# Patient Record
Sex: Female | Born: 1970
Health system: Southern US, Community
[De-identification: ages and names within clinical notes are randomized; demographics above are authoritative.]

## PROBLEM LIST (undated history)

## (undated) DIAGNOSIS — I471 Supraventricular tachycardia: Secondary | ICD-10-CM

## (undated) DIAGNOSIS — I319 Disease of pericardium, unspecified: Secondary | ICD-10-CM

## (undated) DIAGNOSIS — E669 Obesity, unspecified: Secondary | ICD-10-CM

## (undated) HISTORY — DX: Obesity, unspecified: E66.9

## (undated) HISTORY — PX: WISDOM TOOTH EXTRACTION: SHX21

## (undated) HISTORY — DX: Disease of pericardium, unspecified: I31.9

## (undated) HISTORY — DX: Supraventricular tachycardia: I47.1

## (undated) HISTORY — PX: PERICARDIAL WINDOW: SHX2213

---

## 2017-05-13 ENCOUNTER — Encounter: Payer: Self-pay | Admitting: Family Medicine

## 2017-05-13 ENCOUNTER — Other Ambulatory Visit (HOSPITAL_COMMUNITY)
Admission: RE | Admit: 2017-05-13 | Discharge: 2017-05-13 | Disposition: A | Discharge status: Home / Self Care | Payer: BLUE CROSS/BLUE SHIELD | Source: Ambulatory Visit | Attending: Family Medicine | Admitting: Family Medicine

## 2017-05-13 ENCOUNTER — Ambulatory Visit: Payer: BLUE CROSS/BLUE SHIELD | Admitting: Family Medicine

## 2017-05-13 VITALS — BP 124/82 | HR 60 | Temp 98.2°F | Ht 68.0 in | Wt 216.8 lb

## 2017-05-13 DIAGNOSIS — E669 Obesity, unspecified: Secondary | ICD-10-CM | POA: Insufficient documentation

## 2017-05-13 DIAGNOSIS — I471 Supraventricular tachycardia, unspecified: Secondary | ICD-10-CM

## 2017-05-13 DIAGNOSIS — Z0000 Encounter for general adult medical examination without abnormal findings: Secondary | ICD-10-CM

## 2017-05-13 DIAGNOSIS — Z124 Encounter for screening for malignant neoplasm of cervix: Secondary | ICD-10-CM | POA: Insufficient documentation

## 2017-05-13 DIAGNOSIS — I319 Disease of pericardium, unspecified: Secondary | ICD-10-CM

## 2017-05-13 HISTORY — DX: Supraventricular tachycardia, unspecified: I47.10

## 2017-05-13 HISTORY — DX: Obesity, unspecified: E66.9

## 2017-05-13 HISTORY — DX: Supraventricular tachycardia: I47.1

## 2017-05-13 HISTORY — DX: Disease of pericardium, unspecified: I31.9

## 2017-05-13 MED ORDER — PREDNISONE 10 MG PO TABS: ORAL_TABLET | ORAL | 0 refills | Status: DC

## 2017-05-13 NOTE — Progress Notes (Signed)
Subjective:    [name] is a 46 y.o. female and is here for a comprehensive physical exam.  1. Routine physical examination   2. Pap smear for cervical cancer screening   3. Chronic idiopathic pericarditis, unspecified complication status   4. PSVT (paroxysmal supraventricular tachycardia) (HCC)   5. Obesity (BMI 30-39.9)    Health Maintenance Due  Topic Date Due  . HIV Screening  12/08/1987  . TETANUS/TDAP  12/08/1991  . PAP SMEAR  12/07/1993  . INFLUENZA VACCINE  01/30/2017   PMHx, SurgHx, SocialHx, Medications, and Allergies were reviewed in the Visit Navigator and updated as appropriate.   No past medical history on file. No past surgical history on file. No family history on file. Social History   Tobacco Use  . Smoking status: Never Smoker  . Smokeless tobacco: Never Used  Substance Use Topics  . Alcohol use: Yes    Comment: social   . Drug use: No    Review of Systems:   Pertinent items are noted in the HPI. Otherwise, ROS is negative.  Objective:   BP 124/82   Pulse 60   Temp 98.2 F (36.8 C) (Oral)   Ht 5\' 8"  (1.727 m)   Wt 216 lb 12.8 oz (98.3 kg)   SpO2 99%   BMI 32.96 kg/m    Wt Readings from Last 3 Encounters:  05/13/17 216 lb 12.8 oz (98.3 kg)     Ht Readings from Last 3 Encounters:  05/13/17 5\' 8"  (1.727 m)   General appearance: alert, cooperative and appears stated age. Head: normocephalic, without obvious abnormality, atraumatic. Neck: no adenopathy, supple, symmetrical, trachea midline; thyroid not enlarged, symmetric, no tenderness/mass/nodules. Lungs: clear to auscultation bilaterally. Heart: regular rate and rhythm Abdomen: soft, non-tender; no masses,  no organomegaly. Extremities: extremities normal, atraumatic, no cyanosis or edema. Skin: skin color, texture, turgor normal, no rashes or lesions. Lymph: cervical, supraclavicular, and axillary nodes normal; no abnormal inguinal nodes palpated. Neurologic: grossly  normal.  Pelvic:  External genitalia: no lesions.              Urethra: normal appearing urethra with no masses, tenderness or lesions.              Bartholins and Skenes: normal.               Vagina: normal appearing vagina with normal color and discharge, no lesions.              Cervix: normal appearance.              Pap and high risk HPV testing done: Yes.  .        Bimanual Exam:    Uterus: uterus is normal size, shape, consistency and nontender.                                      Adnexa: normal adnexa in size, nontender and no masses.                                       Assessment/Plan:   [name] was seen today for establish care.  Diagnoses and all orders for this visit:  Routine physical examination -     CBC with Differential/Platelet; Future -     Comprehensive metabolic panel; Future -  Lipid panel; Future  Pap smear for cervical cancer screening -     Cytology - PAP  Chronic idiopathic pericarditis, unspecified complication status Comments: Safety net Prednisone okay. Red flags reviewed.  Orders: -     Ambulatory referral to Cardiology -     predniSONE (DELTASONE) 10 MG tablet; 6-5-4-3-2-1-off  PSVT (paroxysmal supraventricular tachycardia) ([place]) Comments: Will refer to Cardiology for ongoing care.  Obesity (BMI 30-39.9) Comments: The patient is asked to make an attempt to improve diet and exercise patterns to aid in medical management of this problem.    Patient Counseling: [x]    Nutrition: Stressed importance of moderation in sodium/caffeine intake, saturated fat and cholesterol, caloric balance, sufficient intake of fresh fruits, vegetables, fiber, calcium, iron, and 1 mg of folate supplement per day (for females capable of pregnancy).  [x]    Stressed the importance of regular exercise.   [x]    Substance Abuse: Discussed cessation/primary prevention of tobacco, alcohol, or other drug use; driving or other dangerous activities under the influence;  availability of treatment for abuse.   [x]    Injury prevention: Discussed safety belts, safety helmets, smoke detector, smoking near bedding or upholstery.   [x]    Sexuality: Discussed sexually transmitted diseases, partner selection, use of condoms, avoidance of unintended pregnancy  and contraceptive alternatives.  [x]    Dental health: Discussed importance of regular tooth brushing, flossing, and dental visits.  [x]    Health maintenance and immunizations reviewed. Please refer to Health maintenance section.   Briscoe Deutscher, DO [place]

## 2017-05-15 ENCOUNTER — Encounter: Payer: Self-pay | Admitting: Surgical

## 2017-05-16 LAB — CYTOLOGY - PAP
Diagnosis: NEGATIVE
HPV: NOT DETECTED

## 2017-05-20 ENCOUNTER — Other Ambulatory Visit: Payer: Self-pay | Admitting: Family Medicine

## 2017-05-20 DIAGNOSIS — Z1231 Encounter for screening mammogram for malignant neoplasm of breast: Secondary | ICD-10-CM

## 2017-06-18 ENCOUNTER — Ambulatory Visit
Admission: RE | Admit: 2017-06-18 | Discharge: 2017-06-18 | Disposition: A | Discharge status: Home / Self Care | Payer: BLUE CROSS/BLUE SHIELD | Source: Ambulatory Visit | Attending: Family Medicine | Admitting: Family Medicine

## 2017-06-18 DIAGNOSIS — Z1231 Encounter for screening mammogram for malignant neoplasm of breast: Secondary | ICD-10-CM

## 2017-09-16 ENCOUNTER — Encounter: Payer: Self-pay | Admitting: Family Medicine

## 2017-09-16 ENCOUNTER — Ambulatory Visit: Payer: BLUE CROSS/BLUE SHIELD | Admitting: Family Medicine

## 2017-09-16 VITALS — BP 110/72 | HR 77 | Temp 98.1°F | Ht 68.0 in | Wt 220.4 lb

## 2017-09-16 DIAGNOSIS — M7632 Iliotibial band syndrome, left leg: Secondary | ICD-10-CM | POA: Diagnosis not present

## 2017-09-16 DIAGNOSIS — M6289 Other specified disorders of muscle: Secondary | ICD-10-CM

## 2017-09-16 DIAGNOSIS — E785 Hyperlipidemia, unspecified: Secondary | ICD-10-CM

## 2017-09-16 DIAGNOSIS — Z0000 Encounter for general adult medical examination without abnormal findings: Secondary | ICD-10-CM

## 2017-09-16 HISTORY — DX: Hyperlipidemia, unspecified: E78.5

## 2017-09-16 LAB — COMPREHENSIVE METABOLIC PANEL
ALT: 15 U/L (ref 0–35)
AST: 21 U/L (ref 0–37)
Albumin: 4 g/dL (ref 3.5–5.2)
Alkaline Phosphatase: 56 U/L (ref 39–117)
BUN: 12 mg/dL (ref 6–23)
CO2: 28 mEq/L (ref 19–32)
Calcium: 9.3 mg/dL (ref 8.4–10.5)
Chloride: 101 mEq/L (ref 96–112)
Creatinine, Ser: 0.65 mg/dL (ref 0.40–1.20)
GFR: 104.87 mL/min (ref >60.00)
Glucose, Bld: 83 mg/dL (ref 70–99)
Potassium: 3.8 mEq/L (ref 3.5–5.1)
Sodium: 137 mEq/L (ref 135–145)
Total Bilirubin: 0.3 mg/dL (ref 0.2–1.2)
Total Protein: 7.5 g/dL (ref 6.0–8.3)

## 2017-09-16 LAB — LIPID PANEL
Cholesterol: 239 mg/dL — ABNORMAL HIGH (ref 0–200)
HDL: 61.4 mg/dL (ref >39.00)
LDL Cholesterol: 141 mg/dL — ABNORMAL HIGH (ref 0–99)
NonHDL: 177.71
Total CHOL/HDL Ratio: 4
Triglycerides: 184 mg/dL — ABNORMAL HIGH (ref 0.0–149.0)
VLDL: 36.8 mg/dL (ref 0.0–40.0)

## 2017-09-16 LAB — CBC WITH DIFFERENTIAL/PLATELET
Basophils Absolute: 0 10*3/uL (ref 0.0–0.1)
Basophils Relative: 0.7 % (ref 0.0–3.0)
Eosinophils Absolute: 0.1 10*3/uL (ref 0.0–0.7)
Eosinophils Relative: 1.7 % (ref 0.0–5.0)
HCT: 42.1 % (ref 36.0–46.0)
Hemoglobin: 14.2 g/dL (ref 12.0–15.0)
Lymphocytes Relative: 23.9 % (ref 12.0–46.0)
Lymphs Abs: 1.2 10*3/uL (ref 0.7–4.0)
MCHC: 33.8 g/dL (ref 30.0–36.0)
MCV: 83.3 fl (ref 78.0–100.0)
Monocytes Absolute: 0.5 10*3/uL (ref 0.1–1.0)
Monocytes Relative: 10.1 % (ref 3.0–12.0)
Neutro Abs: 3.3 10*3/uL (ref 1.4–7.7)
Neutrophils Relative %: 63.6 % (ref 43.0–77.0)
Platelets: 251 10*3/uL (ref 150.0–400.0)
RBC: 5.05 Mil/uL (ref 3.87–5.11)
RDW: 13.2 % (ref 11.5–15.5)
WBC: 5.1 10*3/uL (ref 4.0–10.5)

## 2017-09-16 NOTE — Progress Notes (Signed)
   [name] is a 47 y.o. female here for an acute visit.  History of Present Illness:   Shaune Pascal CMA acting as scribe for Dr. Juleen China.  HPI: Patient comes in today for left leg pain.  She complains of pain in her left lateral distal hamstring and lateral knee.  This started a few weeks ago after working out on her elliptical machine.  She denies any trauma.  She states that she does not have pain when she is exercising but she will intermittently have pain that radiates down her anterior shin.  It is described as pounding.  No numbness or tingling or weakness.  No back pain.  She has done nothing for treatment.  PMHx, SurgHx, SocialHx, Medications, and Allergies were reviewed in the Visit Navigator and updated as appropriate.  Current Medications:   .  Norgestimate-Ethinyl Estradiol Triphasic (TRI-SPRINTEC) 0.18/0.215/0.25 MG-35 MCG tablet, Take 1 tablet daily by mouth., Disp: , Rfl:    No Known Allergies   Review of Systems:   Pertinent items are noted in the HPI. Otherwise, ROS is negative.  Vitals:   Vitals:   09/16/17 0924  BP: 110/72  Pulse: 77  Temp: 98.1 F (36.7 C)  TempSrc: Oral  SpO2: 98%  Weight: 220 lb 6.4 oz (100 kg)  Height: 5\' 8"  (1.727 m)     Body mass index is 33.51 kg/m.  Physical Exam:   Physical Exam  Constitutional: She appears well-nourished.  HENT:  Head: Normocephalic and atraumatic.  Eyes: EOM are normal. Pupils are equal, round, and reactive to light.  Neck: Normal range of motion. Neck supple.  Cardiovascular: Normal rate, regular rhythm, normal heart sounds and intact distal pulses.  Pulmonary/Chest: Effort normal.  Abdominal: Soft.  Musculoskeletal:       Left knee: She exhibits normal range of motion and no swelling. No medial joint line and no lateral joint line tenderness noted.       Legs: Skin: Skin is warm.  Psychiatric: She has a normal mood and affect. Her behavior is normal.  Nursing note and vitals  reviewed.   Assessment and Plan:   1. It band syndrome, left 2. Hamstring tightness of left lower extremity  Exercises provided from the sports medicine patient advisor handout on IT band syndrome.  If she has no improvement over the next 2-4 weeks, she should follow-up with sports medicine.  . Reviewed expectations re: course of current medical issues. . Discussed self-management of symptoms. . Outlined signs and symptoms indicating need for more acute intervention. . Patient verbalized understanding and all questions were answered. Marland Kitchen Health Maintenance issues including appropriate healthy diet, exercise, and smoking avoidance were discussed with patient. . See orders for this visit as documented in the electronic medical record. . Patient received an After Visit Summary.  CMA served as Education administrator during this visit. History, Physical, and Plan performed by medical provider. The above documentation has been reviewed and is accurate and complete. Briscoe Deutscher, D.O.  Briscoe Deutscher, DO [place], [facility] 09/16/2017

## 2017-09-16 NOTE — Addendum Note (Signed)
Addended by: Frutoso Chase A on: 09/16/2017 09:49 AM   Modules accepted: Orders

## 2017-10-29 ENCOUNTER — Encounter: Payer: Self-pay | Admitting: Cardiovascular Disease

## 2017-11-04 ENCOUNTER — Telehealth: Payer: Self-pay | Admitting: Family Medicine

## 2017-11-04 ENCOUNTER — Other Ambulatory Visit: Payer: Self-pay

## 2017-11-04 MED ORDER — NORGESTIM-ETH ESTRAD TRIPHASIC 0.18/0.215/0.25 MG-35 MCG PO TABS: 1.0000 | ORAL_TABLET | Freq: Every day | ORAL | 6 refills | Status: DC

## 2017-11-04 NOTE — Telephone Encounter (Signed)
Okay 

## 2017-11-04 NOTE — Telephone Encounter (Signed)
Never given by you in the past you did CPE in 11/18. Ok to fill?

## 2017-11-04 NOTE — Telephone Encounter (Signed)
Copied from [place] [phone]. Topic: Quick Communication - See Telephone Encounter >> Nov 04, 2017  2:10 PM Vernona Rieger wrote: CRM for notification. See Telephone encounter for: 11/04/17.  Norgestimate-Ethinyl Estradiol Triphasic (TRI-SPRINTEC) 0.18/0.215/0.25 MG-35 MCG tablet  [id number] - [place] [place] - [address] AT [place]

## 2017-11-18 ENCOUNTER — Ambulatory Visit: Payer: BLUE CROSS/BLUE SHIELD | Admitting: Cardiovascular Disease

## 2017-11-18 ENCOUNTER — Encounter: Payer: Self-pay | Admitting: Cardiovascular Disease

## 2017-11-18 VITALS — BP 126/80 | HR 60 | Ht 68.0 in | Wt 219.4 lb

## 2017-11-18 DIAGNOSIS — I471 Supraventricular tachycardia: Secondary | ICD-10-CM | POA: Diagnosis not present

## 2017-11-18 DIAGNOSIS — I32 Pericarditis in diseases classified elsewhere: Secondary | ICD-10-CM

## 2017-11-18 LAB — TSH: TSH: 0.618 u[IU]/mL (ref 0.450–4.500)

## 2017-11-18 NOTE — Patient Instructions (Signed)
Medication Instructions:  Your physician recommends that you continue on your current medications as directed. Please refer to the Current Medication list given to you today.   Labwork: Lab work to be done today--TSH  Testing/Procedures: Your physician has requested that you have an echocardiogram. Echocardiography is a painless test that uses sound waves to create images of your heart. It provides your doctor with information about the size and shape of your heart and how well your heart's chambers and valves are working. This procedure takes approximately one hour. There are no restrictions for this procedure.  Your physician has recommended that you wear an event monitor. Event monitors are medical devices that record the heart's electrical activity. Doctors most often [place] these monitors to diagnose arrhythmias. Arrhythmias are problems with the speed or rhythm of the heartbeat. The monitor is a small, portable device. You can wear one while you do your normal daily activities. This is usually used to diagnose what is causing palpitations/syncope (passing out).    Follow-Up: You are scheduled to see Dr. Angelena Form on August 2,2019 at 4:00 for follow up.   Any Other Special Instructions Will Be Listed Below (If Applicable).     If you need a refill on your cardiac medications before your next appointment, please call your pharmacy.

## 2017-11-18 NOTE — Progress Notes (Signed)
Chief Complaint  Patient presents with  . New Patient (Initial Visit)    palpitations   History of Present Illness: 47 yo female with history of pericarditis s/p pericardial window, [name] who is here today as a new consult, referred by Dr. Juleen China, for the evaluation of pericarditis.  She tells me today that she has been followed for idiopathic recurrent pericarditis in [place]. She was diagnosed with pericarditis/pericardial effusion in 2008 at [place]. She had a pericardial window at [facility] in 2008. She was told there thickening of her pericardium. She was treated with Methotrexate for a short time. She takes prednisone as needed when she has bad chest pain. She has had no recent echocardiogram. She has [name]. She wore a monitor while living in [place] 2 years ago and was noted to have [name]. She feels her heart racing several days per week. She has not been on therapy for her [name]. Longest episode occurred yesterday and lasted for 10 minutes. She moved to [facility] in June 2018. She teaches at [employer].   She has no chest pain, dyspnea, weight gain, LE edema.   Primary Care Physician: Briscoe Deutscher, DO   Past Medical History:  Diagnosis Date  . Chronic idiopathic pericarditis 05/13/2017  . Obesity (BMI 30-39.9) 05/13/2017   .  Marland Kitchen PSVT (paroxysmal supraventricular tachycardia) ([place]) 05/13/2017    Past Surgical History:  Procedure Laterality Date  . CESAREAN SECTION    . PERICARDIAL WINDOW      Current Outpatient Medications  Medication Sig Dispense Refill  . Norgestimate-Ethinyl Estradiol Triphasic (TRI-SPRINTEC) 0.18/0.215/0.25 MG-35 MCG tablet Take 1 tablet by mouth daily. 1 Package 6   No current facility-administered medications for this visit.     No Known Allergies  Social History   Socioeconomic History  . Marital status: Married    Spouse name: Not on file  . Number of children: 1  . Years of education: Not on file  . Highest education level: Not on file  Occupational  History  . Occupation: Professor at [employer]  . Financial resource strain: Not on file  . Food insecurity:    Worry: Not on file    Inability: Not on file  . Transportation needs:    Medical: Not on file    Non-medical: Not on file  Tobacco Use  . Smoking status: Never Smoker  . Smokeless tobacco: Never Used  Substance and Sexual Activity  . Alcohol use: Yes    Comment: social   . Drug use: No  . Sexual activity: Not on file  Lifestyle  . Physical activity:    Days per week: Not on file    Minutes per session: Not on file  . Stress: Not on file  Relationships  . Social connections:    Talks on phone: Not on file    Gets together: Not on file    Attends religious service: Not on file    Active member of club or organization: Not on file    Attends meetings of clubs or organizations: Not on file    Relationship status: Not on file  . Intimate partner violence:    Fear of current or ex partner: Not on file    Emotionally abused: Not on file    Physically abused: Not on file    Forced sexual activity: Not on file  Other Topics Concern  . Not on file  Social History Narrative  . Not on file    Family History  Problem Relation Age of Onset  . Arthritis Mother   . Diabetes Father   . Early death Father        Cardiogenic shock/CAD/MI  . High Cholesterol Father   . Cancer Maternal Grandfather   . Cancer Paternal Grandmother     Review of Systems:  As stated in the HPI and otherwise negative.   BP 126/80   Pulse 60   Ht 5\' 8"  (1.727 m)   Wt 219 lb 6.4 oz (99.5 kg)   SpO2 98%   BMI 33.36 kg/m   Physical Examination: General: Well developed, well nourished, NAD  HEENT: OP clear, mucus membranes moist  SKIN: warm, dry. No rashes. Neuro: No focal deficits  Musculoskeletal: Muscle strength 5/5 all ext  Psychiatric: Mood and affect normal  Neck: No JVD, no carotid bruits, no thyromegaly, no lymphadenopathy.  Lungs:Clear bilaterally, no  wheezes, rhonci, crackles Cardiovascular: Regular rate and rhythm. No murmurs, gallops or rubs. Abdomen:Soft. Bowel sounds present. Non-tender.  Extremities: No lower extremity edema. Pulses are 2 + in the bilateral DP/PT.  EKG:  EKG is ordered today. The ekg ordered today demonstrates NSR, rate 60 bpm  Recent Labs: 09/16/2017: ALT 15; BUN 12; Creatinine, Ser 0.65; Hemoglobin 14.2; Platelets 251.0; Potassium 3.8; Sodium 137   Lipid Panel    Component Value Date/Time   CHOL 239 (H) 09/16/2017 0950   TRIG 184.0 (H) 09/16/2017 0950   HDL 61.40 09/16/2017 0950   CHOLHDL 4 09/16/2017 0950   VLDL 36.8 09/16/2017 0950   LDLCALC 141 (H) 09/16/2017 0950     Wt Readings from Last 3 Encounters:  11/18/17 219 lb 6.4 oz (99.5 kg)  09/16/17 220 lb 6.4 oz (100 kg)  05/13/17 216 lb 12.8 oz (98.3 kg)     Other studies Reviewed: Additional studies/ records that were reviewed today include: . Review of the above records demonstrates:    Assessment and Plan:   1. [name]: She is known to have [name]. She has been on no medical therapy. Episodes occur several times per week and last for a few minutes. Will arrange 30 day monitor now. Consider adding calcium channel blocker but if she cannot tolerate due to bradycardia will consider ablation. Check echo and TSH.   2. Chronic pericarditis: She has had no recent chest pain. She will use prednisone as needed. Will arrange an echo to assess her LV function and exclude pericardial   Current medicines are reviewed at length with the patient today.  The patient does not have concerns regarding medicines.  The following changes have been made:  no change  Labs/ tests ordered today include:   Orders Placed This Encounter  Procedures  . TSH  . CARDIAC EVENT MONITOR  . ECHOCARDIOGRAM COMPLETE     Disposition:   FU with me in 6 weeks   Signed, Lauree Chandler, MD 11/18/2017 9:21 AM    [facility] [address],  [place], [place]  [address] Phone: [phone]; Fax: [phone]

## 2017-11-18 NOTE — Addendum Note (Signed)
Addended by: Mendel Ryder on: 11/18/2017 11:39 AM   Modules accepted: Orders

## 2017-11-29 ENCOUNTER — Ambulatory Visit (INDEPENDENT_AMBULATORY_CARE_PROVIDER_SITE_OTHER): Payer: BLUE CROSS/BLUE SHIELD

## 2017-11-29 ENCOUNTER — Ambulatory Visit (HOSPITAL_COMMUNITY): Payer: BLUE CROSS/BLUE SHIELD | Attending: Cardiology

## 2017-11-29 ENCOUNTER — Other Ambulatory Visit: Payer: Self-pay

## 2017-11-29 DIAGNOSIS — I32 Pericarditis in diseases classified elsewhere: Secondary | ICD-10-CM | POA: Diagnosis not present

## 2017-11-29 DIAGNOSIS — I471 Supraventricular tachycardia: Secondary | ICD-10-CM | POA: Insufficient documentation

## 2017-11-30 DIAGNOSIS — I471 Supraventricular tachycardia: Secondary | ICD-10-CM | POA: Diagnosis not present

## 2017-12-15 ENCOUNTER — Telehealth: Payer: Self-pay | Admitting: Cardiology

## 2017-12-15 NOTE — Telephone Encounter (Signed)
[facility] called stating that patient had [name] at 200bpm for 1 minute and resolved and now in NSR at 70bpm. Patient complained of palpitations at the time.

## 2017-12-16 MED ORDER — DILTIAZEM HCL 30 MG PO TABS: ORAL_TABLET | ORAL | 2 refills | Status: DC

## 2017-12-16 NOTE — Addendum Note (Signed)
Addended by: Thompson Grayer on: 12/16/2017 11:15 AM   Modules accepted: Orders

## 2017-12-16 NOTE — Telephone Encounter (Signed)
Pt notified. States she has been having similar episodes about once a week for many years.  She is aware of vagal maneuvers.  [facility] [address]

## 2017-12-16 NOTE — Telephone Encounter (Signed)
Pat, I am not sure when her monitor will be completed. She had a run of [name] per phone note. Can we call in Cardizem 30 mg po to use every six hours as needed for palpitations. I will review her monitor when complete and make more recommendations. She can also try vagal maneuvers to break the [name] if it recurs. She has had [name] in the past so this is not a surprise. She may opt for EP referral for ablation. Thanks, chris

## 2018-01-22 ENCOUNTER — Ambulatory Visit: Payer: BLUE CROSS/BLUE SHIELD | Admitting: Cardiology

## 2018-01-22 ENCOUNTER — Encounter: Payer: Self-pay | Admitting: Cardiology

## 2018-01-22 VITALS — BP 122/80 | HR 69 | Ht 68.0 in | Wt 227.8 lb

## 2018-01-22 DIAGNOSIS — I319 Disease of pericardium, unspecified: Secondary | ICD-10-CM | POA: Diagnosis not present

## 2018-01-22 DIAGNOSIS — I471 Supraventricular tachycardia: Secondary | ICD-10-CM | POA: Diagnosis not present

## 2018-01-22 NOTE — Progress Notes (Signed)
Electrophysiology Office Note   Date:  01/22/2018   ID:  [name], DOB 08/23/1970, MRN [id number]  PCP:  Briscoe Deutscher, DO  Cardiologist:  Angelena Form Primary Electrophysiologist:  Maclovia Uher Meredith Leeds, MD    Chief Complaint  Patient presents with  . Advice Only    [name]     History of Present Illness: [name] is a 47 y.o. female who is being seen today for the evaluation of [name] at the request of Darlina Guys. She is a Pharmacist, hospital at [employer]. Presenting today for electrophysiology evaluation.  She has a history of pericarditis status post pericardial window.  She was seen by general cardiology with [name].  She has been followed for recurrent idiopathic pericarditis in the past in [place].  She was diagnosed in 2008.  At that time, she had a pericardial window at [facility].  She had been treated with methotrexate in the past.  She also has [name].  She was living in [place] 2 years ago when she noted [name].  She has heart racing several times a week.  She has not yet been on therapy for [name].  Episodes last for up to 10 minutes.  Most of her episodes last between 2 to 3 minutes.  She has tried Valsalva maneuvers which have not been successful.    Today, she denies symptoms of chest pain, shortness of breath, orthopnea, PND, lower extremity edema, claudication, dizziness, presyncope, syncope, bleeding, or neurologic sequela. The patient is tolerating medications without difficulties.    Past Medical History:  Diagnosis Date  . Chronic idiopathic pericarditis 05/13/2017  . Obesity (BMI 30-39.9) 05/13/2017   .  Marland Kitchen PSVT (paroxysmal supraventricular tachycardia) ([place]) 05/13/2017   Past Surgical History:  Procedure Laterality Date  . CESAREAN SECTION    . PERICARDIAL WINDOW       Current Outpatient Medications  Medication Sig Dispense Refill  . diltiazem (CARDIZEM) 30 MG tablet Take one tablet by mouth every 6 hours as needed for palpitations. 30 tablet 2  .  Norgestimate-Ethinyl Estradiol Triphasic (TRI-SPRINTEC) 0.18/0.215/0.25 MG-35 MCG tablet Take 1 tablet by mouth daily. 1 Package 6   No current facility-administered medications for this visit.     Allergies:   Patient has no known allergies.   Social History:  The patient  reports that she has never smoked. She has never used smokeless tobacco. She reports that she drinks alcohol. She reports that she does not use drugs.   Family History:  The patient's family history includes Arthritis in her mother; Cancer in her maternal grandfather and paternal grandmother; Diabetes in her father; Early death in her father; High Cholesterol in her father.    ROS:  Please see the history of present illness.   Otherwise, review of systems is positive for palpitations.   All other systems are reviewed and negative.    PHYSICAL EXAM: VS:  BP 122/80   Pulse 69   Ht 5\' 8"  (1.727 m)   Wt 227 lb 12.8 oz (103.3 kg)   SpO2 98%   BMI 34.64 kg/m  , BMI Body mass index is 34.64 kg/m. GEN: Well nourished, well developed, in no acute distress  HEENT: normal  Neck: no JVD, carotid bruits, or masses Cardiac: RRR; no murmurs, rubs, or gallops,no edema  Respiratory:  clear to auscultation bilaterally, normal work of breathing GI: soft, nontender, nondistended, + BS MS: no deformity or atrophy  Skin: warm and dry Neuro:  Strength and sensation are intact Psych: euthymic mood, full affect  EKG:  EKG is not ordered today. Personal review of the ekg ordered 11/18/17 shows SR, rate 60  Recent Labs: 09/16/2017: ALT 15; BUN 12; Creatinine, Ser 0.65; Hemoglobin 14.2; Platelets 251.0; Potassium 3.8; Sodium 137 11/18/2017: TSH 0.618    Lipid Panel     Component Value Date/Time   CHOL 239 (H) 09/16/2017 0950   TRIG 184.0 (H) 09/16/2017 0950   HDL 61.40 09/16/2017 0950   CHOLHDL 4 09/16/2017 0950   VLDL 36.8 09/16/2017 0950   LDLCALC 141 (H) 09/16/2017 0950     Wt Readings from Last 3 Encounters:  01/22/18  227 lb 12.8 oz (103.3 kg)  11/18/17 219 lb 6.4 oz (99.5 kg)  09/16/17 220 lb 6.4 oz (100 kg)      Other studies Reviewed: Additional studies/ records that were reviewed today include: TTE 11/29/17  Review of the above records today demonstrates:  - Left ventricle: The cavity size was normal. Systolic function was   normal. The estimated ejection fraction was in the range of 55%   to 60%. Wall motion was normal; there were no regional wall   motion abnormalities. Left ventricular diastolic function   parameters were normal. - Atrial septum: No defect or patent foramen ovale was identified.  30 day monitor 11/29/17 - personally reviewed Sinus rhythm Premature ventricular contractions Several episodes of supraventricular tachycardia ([name])  ASSESSMENT AND PLAN:  1.  [name]: Wore a monitor that showed multiple episodes.  Episodes appear to be due to an AV nodal mechanism, likely AVNRT.  I discussed with her risks and benefits of ablation versus medical management.  Risks of ablation include bleeding, tamponade, heart block, stroke, among others.  This point, she would like to stay off of medications and is not symptomatic enough for ablation.  She Reighan Hipolito think about her options and I Marguerite Barba see her back in 3 months.  2.  Chronic pericarditis: No recent chest pain.Recent echo without pericardial effusion recent echo without pericardial effusion, otherwise normal.  Plan per primary cardiology.    Current medicines are reviewed at length with the patient today.   The patient does not have concerns regarding her medicines.  The following changes were made today:  none  Labs/ tests ordered today include:  No orders of the defined types were placed in this encounter.  Case discussed with primary cardiology  Disposition:   FU with Birdie Beveridge 3 months  Signed, Myrl Bynum Meredith Leeds, MD  01/22/2018 9:25 AM     [facility] [address] [place] [address] [phone]  (office) [phone] (fax)

## 2018-01-22 NOTE — Patient Instructions (Signed)
Medication Instructions:  Your physician recommends that you continue on your current medications as directed. Please refer to the Current Medication list given to you today.  * If you need a refill on your cardiac medications before your next appointment, please call your pharmacy.   Labwork: None ordered *We will only notify you of abnormal results, otherwise continue current treatment plan.  Testing/Procedures: None ordered  Follow-Up: Your physician recommends that you schedule a follow-up appointment in: 3 months with Dr. Curt Bears.  Your physician recommends that you re-schedule a follow-up appointment to: December with Dr. Angelena Form.  *Please note that any paperwork needing to be filled out by the provider will need to be addressed at the front desk prior to seeing the provider. Please note that any FMLA, disability or other documents regarding health condition is subject to a $25.00 charge that must be received prior to completion of paperwork in the form of a money order or check.  Thank you for choosing [facility]!!   Trinidad Curet, RN [phone]

## 2018-01-31 ENCOUNTER — Ambulatory Visit: Payer: BLUE CROSS/BLUE SHIELD | Admitting: Cardiovascular Disease

## 2018-04-19 ENCOUNTER — Other Ambulatory Visit: Payer: Self-pay | Admitting: Family Medicine

## 2018-04-21 NOTE — Telephone Encounter (Signed)
OK to refill. Patients last appointment 09/16/17 (acute). I have left message for her to call back to schedule.

## 2018-04-22 ENCOUNTER — Ambulatory Visit: Payer: BLUE CROSS/BLUE SHIELD | Admitting: Cardiology

## 2018-04-22 ENCOUNTER — Encounter: Payer: Self-pay | Admitting: Cardiology

## 2018-04-22 VITALS — BP 120/72 | HR 76 | Ht 68.0 in | Wt 234.0 lb

## 2018-04-22 DIAGNOSIS — I471 Supraventricular tachycardia: Secondary | ICD-10-CM

## 2018-04-22 DIAGNOSIS — I319 Disease of pericardium, unspecified: Secondary | ICD-10-CM | POA: Diagnosis not present

## 2018-04-22 NOTE — Progress Notes (Signed)
Electrophysiology Office Note   Date:  04/22/2018   ID:  [name], DOB 1970-07-26, MRN [id number]  PCP:  Briscoe Deutscher, DO  Cardiologist:  Angelena Form Primary Electrophysiologist:  Jammi Morrissette Meredith Leeds, MD    No chief complaint on file.    History of Present Illness: [name] is a 47 y.o. female who is being seen today for the evaluation of [name] at the request of Darlina Guys. She is a Pharmacist, hospital at [employer]. Presenting today for electrophysiology evaluation.  She has a history of pericarditis status post pericardial window.  She was seen by general cardiology with [name].  She has been followed for recurrent idiopathic pericarditis in the past in [place].  She was diagnosed in 2008.  At that time, she had a pericardial window at [facility].  She had been treated with methotrexate in the past.  She also has [name].  She was living in [place] 2 years ago when she noted [name].  She has heart racing several times a week.  She has not yet been on therapy for [name].  Episodes last for up to 10 minutes.  Most of her episodes last between 2 to 3 minutes.  She has tried Valsalva maneuvers which have not been successful.  Today, denies symptoms of palpitations, chest pain, shortness of breath, orthopnea, PND, lower extremity edema, claudication, dizziness, presyncope, syncope, bleeding, or neurologic sequela. The patient is tolerating medications without difficulties.  Overall she is feeling well.  She does continue to have episodes of [name], though they are remaining short-lived.  She has not had to take any of her PRN diltiazem.   Past Medical History:  Diagnosis Date  . Chronic idiopathic pericarditis 05/13/2017  . Obesity (BMI 30-39.9) 05/13/2017   .  Marland Kitchen PSVT (paroxysmal supraventricular tachycardia) ([place]) 05/13/2017   Past Surgical History:  Procedure Laterality Date  . CESAREAN SECTION    . PERICARDIAL WINDOW       Current Outpatient Medications  Medication Sig Dispense Refill  .  diltiazem (CARDIZEM) 30 MG tablet Take one tablet by mouth every 6 hours as needed for palpitations. 30 tablet 2  . TRI-PREVIFEM 0.18/0.215/0.25 MG-35 MCG tablet TAKE 1 TABLET BY MOUTH EVERY DAY 84 tablet 2   No current facility-administered medications for this visit.     Allergies:   Patient has no known allergies.   Social History:  The patient  reports that she has never smoked. She has never used smokeless tobacco. She reports that she drinks alcohol. She reports that she does not use drugs.   Family History:  The patient's family history includes Arthritis in her mother; Cancer in her maternal grandfather and paternal grandmother; Diabetes in her father; Early death in her father; High Cholesterol in her father.    ROS:  Please see the history of present illness.   Otherwise, review of systems is positive for none.   All other systems are reviewed and negative.   PHYSICAL EXAM: VS:  BP 120/72   Pulse 76   Ht 5\' 8"  (1.727 m)   Wt 234 lb (106.1 kg)   SpO2 97%   BMI 35.58 kg/m  , BMI Body mass index is 35.58 kg/m. GEN: Well nourished, well developed, in no acute distress  HEENT: normal  Neck: no JVD, carotid bruits, or masses Cardiac: RRR; no murmurs, rubs, or gallops,no edema  Respiratory:  clear to auscultation bilaterally, normal work of breathing GI: soft, nontender, nondistended, + BS MS: no deformity or atrophy  Skin: warm  and dry Neuro:  Strength and sensation are intact Psych: euthymic mood, full affect  EKG:  EKG is not ordered today. Personal review of the ekg ordered 11/18/17 shows SR, rate 60   Recent Labs: 09/16/2017: ALT 15; BUN 12; Creatinine, Ser 0.65; Hemoglobin 14.2; Platelets 251.0; Potassium 3.8; Sodium 137 11/18/2017: TSH 0.618    Lipid Panel     Component Value Date/Time   CHOL 239 (H) 09/16/2017 0950   TRIG 184.0 (H) 09/16/2017 0950   HDL 61.40 09/16/2017 0950   CHOLHDL 4 09/16/2017 0950   VLDL 36.8 09/16/2017 0950   LDLCALC 141 (H) 09/16/2017  0950     Wt Readings from Last 3 Encounters:  04/22/18 234 lb (106.1 kg)  01/22/18 227 lb 12.8 oz (103.3 kg)  11/18/17 219 lb 6.4 oz (99.5 kg)      Other studies Reviewed: Additional studies/ records that were reviewed today include: TTE 11/29/17  Review of the above records today demonstrates:  - Left ventricle: The cavity size was normal. Systolic function was   normal. The estimated ejection fraction was in the range of 55%   to 60%. Wall motion was normal; there were no regional wall   motion abnormalities. Left ventricular diastolic function   parameters were normal. - Atrial septum: No defect or patent foramen ovale was identified.  30 day monitor 11/29/17 - personally reviewed Sinus rhythm Premature ventricular contractions Several episodes of supraventricular tachycardia ([name])  ASSESSMENT AND PLAN:  1.  [name]: Her a cardiac monitor that showed multiple episodes of tachycardia that appeared to have an AV nodal mechanism, likely AVNRT.  I did discuss with her the options of ablation versus medical management.  At this point, she is having minimal symptoms from her short lived [name].  She would like to continue with the PRN diltiazem.  2.  Chronic pericarditis: No recent chest pain.  Echo without effusion.  Otherwise normal.  Plan per primary cardiology.    Current medicines are reviewed at length with the patient today.   The patient does not have concerns regarding her medicines.  The following changes were made today: None  Labs/ tests ordered today include:  No orders of the defined types were placed in this encounter.   Disposition:   FU with Niyonna Betsill 6 months  Signed, Gerik Coberly Meredith Leeds, MD  04/22/2018 8:32 AM     [facility] [address] [place] [address] [phone] (office) [phone] (fax)

## 2018-04-22 NOTE — Patient Instructions (Signed)
Medication Instructions:  Your physician recommends that you continue on your current medications as directed. Please refer to the Current Medication list given to you today  If you need a refill on your cardiac medications before your next appointment, please call your pharmacy.   Lab work: None ordered  Testing/Procedures: None ordered   Follow-Up: At [facility], you and your health needs are our priority.  As part of our continuing mission to provide you with exceptional heart care, we have created designated Provider Care Teams.  These Care Teams include your primary Cardiologist (physician) and Advanced Practice Providers (APPs -  Physician Assistants and Nurse Practitioners) who all work together to provide you with the care you need, when you need it. You will need a follow up appointment in 6 months.  Please call our office 2 months in advance to schedule this appointment.  You may see Will Martin Camnitz, MD or one of the following Advanced Practice Providers on your designated Care Team:   Amber Seiler, NP . Renee Ursuy, PA-C  Thank you for choosing [facility]!!   Navid Lenzen, RN [phone]      

## 2018-05-09 ENCOUNTER — Other Ambulatory Visit: Payer: Self-pay

## 2018-05-09 ENCOUNTER — Telehealth: Payer: Self-pay | Admitting: *Deleted

## 2018-05-09 DIAGNOSIS — Z23 Encounter for immunization: Secondary | ICD-10-CM

## 2018-05-09 MED ORDER — MEASLES, MUMPS & RUBELLA VAC IJ SOLR: 0.5000 mL | Freq: Once | INTRAMUSCULAR | Status: DC

## 2018-05-09 NOTE — Telephone Encounter (Signed)
Ok to be sent in or does she need levels checked?

## 2018-05-09 NOTE — Telephone Encounter (Signed)
Script called in. L/m ok to leave v/m per DPR. L/m to let patient know.

## 2018-05-09 NOTE — Telephone Encounter (Signed)
Okay Rx.

## 2018-05-09 NOTE — Telephone Encounter (Signed)
Copied from [place]. Topic: General - Other >> May 09, 2018  8:08 AM Carolyn Stare wrote:  Pt has a mump breaout at her job and they are [place] staff to get a 3rd MMR . She is asking a RX be [facility] [place] on [address]

## 2018-06-16 ENCOUNTER — Ambulatory Visit: Payer: BLUE CROSS/BLUE SHIELD | Admitting: Cardiovascular Disease

## 2018-06-16 ENCOUNTER — Encounter: Payer: Self-pay | Admitting: Cardiovascular Disease

## 2018-06-16 VITALS — BP 110/70 | HR 67 | Ht 68.0 in | Wt 234.6 lb

## 2018-06-16 DIAGNOSIS — I471 Supraventricular tachycardia: Secondary | ICD-10-CM | POA: Diagnosis not present

## 2018-06-16 DIAGNOSIS — I319 Disease of pericardium, unspecified: Secondary | ICD-10-CM

## 2018-06-16 MED ORDER — PREDNISONE 10 MG PO TABS: ORAL_TABLET | ORAL | 0 refills | Status: DC

## 2018-06-16 NOTE — Progress Notes (Signed)
Chief Complaint  Patient presents with  . Follow-up    [name]   History of Present Illness: 47 yo female with history of pericarditis s/p pericardial window and [name] who is here today for cardiac follow up. I saw her as a new patient in May 2019 for the evaluation of pericarditis.  She has been followed for idiopathic recurrent pericarditis in [place]. She was diagnosed with pericarditis/pericardial effusion in 2008 at [place]. She had a pericardial window at [facility] in 2008. She was told there was thickening of her pericardium. She was treated with Methotrexate for a short time. She takes prednisone as needed when she has bad chest pain. She has had no recent echocardiogram. She has [name]. She wore a monitor while living in [place] 2 years ago and was noted to have [name]. She feels her heart racing several days per week. She has not been on therapy for her [name]. She moved to [facility] in June 2018. She teaches at [employer]. Echo May 2019 with LVEF=55-60%. No valve disease. Cardiac monitor May 2019 with runs of [name], likely AVNRT. She was seen in our EP clinic by Dr. Lennie Odor and given minimal symptoms she continued with plan to use diltiazem as needed. Ablation was discussed and would be planned if symptoms worsen.   She is here today for follow up. The patient denies any dyspnea, lower extremity edema, orthopnea, PND, dizziness, near syncope or syncope. She had one episode of tachycardia recently that lasted 8 minutes. Rare chest pains.   Primary Care Physician: Briscoe Deutscher, DO  Past Medical History:  Diagnosis Date  . Chronic idiopathic pericarditis 05/13/2017  . Obesity (BMI 30-39.9) 05/13/2017   .  Marland Kitchen PSVT (paroxysmal supraventricular tachycardia) ([place]) 05/13/2017    Past Surgical History:  Procedure Laterality Date  . CESAREAN SECTION    . PERICARDIAL WINDOW      Current Outpatient Medications  Medication Sig Dispense Refill  . diltiazem (CARDIZEM) 30 MG tablet Take one tablet by mouth every  6 hours as needed for palpitations. 30 tablet 2  . TRI-PREVIFEM 0.18/0.215/0.25 MG-35 MCG tablet TAKE 1 TABLET BY MOUTH EVERY DAY 84 tablet 2  . predniSONE (DELTASONE) 10 MG tablet Take as directed. 10 tablet 0   Current Facility-Administered Medications  Medication Dose Route Frequency Provider Last Rate Last Dose  . measles, mumps & rubella vaccine (MMR) injection 0.5 mL  0.5 mL Subcutaneous Once Briscoe Deutscher, DO        No Known Allergies  Social History   Socioeconomic History  . Marital status: Married    Spouse name: Not on file  . Number of children: 1  . Years of education: Not on file  . Highest education level: Not on file  Occupational History  . Occupation: Professor at [employer]  . Financial resource strain: Not on file  . Food insecurity:    Worry: Not on file    Inability: Not on file  . Transportation needs:    Medical: Not on file    Non-medical: Not on file  Tobacco Use  . Smoking status: Never Smoker  . Smokeless tobacco: Never Used  Substance and Sexual Activity  . Alcohol use: Yes    Comment: social   . Drug use: No  . Sexual activity: Not on file  Lifestyle  . Physical activity:    Days per week: Not on file    Minutes per session: Not on file  . Stress: Not on file  Relationships  . Social connections:    Talks on phone: Not on file    Gets together: Not on file    Attends religious service: Not on file    Active member of club or organization: Not on file    Attends meetings of clubs or organizations: Not on file    Relationship status: Not on file  . Intimate partner violence:    Fear of current or ex partner: Not on file    Emotionally abused: Not on file    Physically abused: Not on file    Forced sexual activity: Not on file  Other Topics Concern  . Not on file  Social History Narrative  . Not on file    Family History  Problem Relation Age of Onset  . Arthritis Mother   . Diabetes Father   . Early death  Father        Cardiogenic shock/CAD/MI  . High Cholesterol Father   . Cancer Maternal Grandfather   . Cancer Paternal Grandmother     Review of Systems:  As stated in the HPI and otherwise negative.   BP 110/70   Pulse 67   Ht 5' 8"  (1.727 m)   Wt 234 lb 9.6 oz (106.4 kg)   SpO2 97%   BMI 35.67 kg/m   Physical Examination: General: Well developed, well nourished, NAD  HEENT: OP clear, mucus membranes moist  SKIN: warm, dry. No rashes. Neuro: No focal deficits  Musculoskeletal: Muscle strength 5/5 all ext  Psychiatric: Mood and affect normal  Neck: No JVD, no carotid bruits, no thyromegaly, no lymphadenopathy.  Lungs:Clear bilaterally, no wheezes, rhonci, crackles Cardiovascular: Regular rate and rhythm. No murmurs, gallops or rubs. Abdomen:Soft. Bowel sounds present. Non-tender.  Extremities: No lower extremity edema. Pulses are 2 + in the bilateral DP/PT.  Echo May 2019: Left ventricle: The cavity size was normal. Systolic function was   normal. The estimated ejection fraction was in the range of 55%   to 60%. Wall motion was normal; there were no regional wall   motion abnormalities. Left ventricular diastolic function   parameters were normal. - Atrial septum: No defect or patent foramen ovale was identified.  EKG:  EKG is not ordered today. The ekg ordered today demonstrates   Recent Labs: 09/16/2017: ALT 15; BUN 12; Creatinine, Ser 0.65; Hemoglobin 14.2; Platelets 251.0; Potassium 3.8; Sodium 137 11/18/2017: TSH 0.618   Lipid Panel    Component Value Date/Time   CHOL 239 (H) 09/16/2017 0950   TRIG 184.0 (H) 09/16/2017 0950   HDL 61.40 09/16/2017 0950   CHOLHDL 4 09/16/2017 0950   VLDL 36.8 09/16/2017 0950   LDLCALC 141 (H) 09/16/2017 0950     Wt Readings from Last 3 Encounters:  06/16/18 234 lb 9.6 oz (106.4 kg)  04/22/18 234 lb (106.1 kg)  01/22/18 227 lb 12.8 oz (103.3 kg)     Other studies Reviewed: Additional studies/ records that were reviewed  today include: . Review of the above records demonstrates:    Assessment and Plan:   1. [name]: Cardiac monitor in May 2019 showed multiple episodes of tachycardia that appear to be AVNRT. She has been seen in our EP clinic. Would need ablation if she becomes more symptomatic. For now continue Cardizem as needed.    2. Chronic pericarditis: No effusion by echo in May 2019. No chest pain.   Current medicines are reviewed at length with the patient today.  The patient does not have concerns regarding medicines.  The following changes have been made:  no change  Labs/ tests ordered today include:   No orders of the defined types were placed in this encounter.  Disposition:   FU with me in 12 months.   Signed, Lauree Chandler, MD 06/16/2018 9:22 AM    [facility] [place], [place], [place]  [address] Phone: [phone]; Fax: [phone]

## 2018-06-16 NOTE — Patient Instructions (Signed)
Medication Instructions:  Your physician recommends that you continue on your current medications as directed. Please refer to the Current Medication list given to you today. A prescription for prednisone to take as needed has been sent to your pharmacy If you need a refill on your cardiac medications before your next appointment, please call your pharmacy.   Lab work: none If you have labs (blood work) drawn today and your tests are completely normal, you will receive your results only by: Marland Kitchen MyChart Message (if you have MyChart) OR . A paper copy in the mail If you have any lab test that is abnormal or we need to change your treatment, we will call you to review the results.  Testing/Procedures: none  Follow-Up: At [facility], you and your health needs are our priority.  As part of our continuing mission to provide you with exceptional heart care, we have created designated Provider Care Teams.  These Care Teams include your primary Cardiologist (physician) and Advanced Practice Providers (APPs -  Physician Assistants and Nurse Practitioners) who all work together to provide you with the care you need, when you need it. You will need a follow up appointment in 12 months.  Please call our office 2 months in advance to schedule this appointment.  You may see Lauree Chandler, MD or one of the following Advanced Practice Providers on your designated Care Team:   [place], PA-C Melina Copa, PA-C . Ermalinda Barrios, PA-C  Any Other Special Instructions Will Be Listed Below (If Applicable).

## 2018-06-18 ENCOUNTER — Telehealth: Payer: Self-pay | Admitting: Family Medicine

## 2018-06-18 NOTE — Telephone Encounter (Signed)
See note ° °Copied from CRM #[id number]. Topic: General - Other °>> Jun 18, 2018  3:35 PM Williams-Neal, Sade R wrote: °Patient called in to schedule a appt to receive the MMR vaccine. There was [facility] for measles, mumps & rubella vaccine (MMR) injection 0.5 mL. Patient states [facility] didn't receive it ° °

## 2018-06-19 NOTE — Telephone Encounter (Signed)
Called patient made app for her to come in for nurse visit. Apologized to patient that I had sent wrong and asked if she wanted me to resend to pharmacy but she would prefer to have in office.

## 2018-06-26 ENCOUNTER — Ambulatory Visit (INDEPENDENT_AMBULATORY_CARE_PROVIDER_SITE_OTHER): Payer: BLUE CROSS/BLUE SHIELD

## 2018-06-26 DIAGNOSIS — Z23 Encounter for immunization: Secondary | ICD-10-CM

## 2018-06-26 NOTE — Progress Notes (Signed)
Per orders of Dr. Rogers Blocker, injection of MMR given by Francella Solian. Patient tolerated injection well. VIS given at time of visit. Will call if any questions

## 2018-07-06 ENCOUNTER — Encounter: Payer: Self-pay | Admitting: Cardiovascular Disease

## 2018-07-18 ENCOUNTER — Other Ambulatory Visit: Payer: Self-pay | Admitting: Family Medicine

## 2018-07-18 DIAGNOSIS — Z1231 Encounter for screening mammogram for malignant neoplasm of breast: Secondary | ICD-10-CM

## 2018-07-28 ENCOUNTER — Ambulatory Visit
Admission: RE | Admit: 2018-07-28 | Discharge: 2018-07-28 | Disposition: A | Discharge status: Home / Self Care | Payer: BLUE CROSS/BLUE SHIELD | Source: Ambulatory Visit

## 2018-07-28 DIAGNOSIS — Z1231 Encounter for screening mammogram for malignant neoplasm of breast: Secondary | ICD-10-CM

## 2018-07-28 NOTE — Progress Notes (Signed)
Subjective:    [name] is a 48 y.o. female and is here for a comprehensive physical exam.  There are no preventive care reminders to display for this patient.  Current Outpatient Medications:  .  diltiazem (CARDIZEM) 30 MG tablet, Take one tablet by mouth every 6 hours as needed for palpitations., Disp: 30 tablet, Rfl: 2 .  predniSONE (DELTASONE) 10 MG tablet, Take 10 mg by mouth daily with breakfast. Take as needed., Disp: , Rfl:  .  norgestimate-ethinyl estradiol (PREVIFEM) 0.25-35 MG-MCG tablet, Take 1 tablet by mouth daily., Disp: 1 Package, Rfl: 11 .  terbinafine (LAMISIL) 250 MG tablet, Take 1 tablet (250 mg total) by mouth daily., Disp: 45 tablet, Rfl: 0  PMHx, SurgHx, SocialHx, Medications, and Allergies were reviewed in the Visit Navigator and updated as appropriate.   Past Medical History:  Diagnosis Date  . Chronic idiopathic pericarditis 05/13/2017  . Obesity (BMI 30-39.9) 05/13/2017  . PSVT (paroxysmal supraventricular tachycardia) ([place]) 05/13/2017     Past Surgical History:  Procedure Laterality Date  . CESAREAN SECTION    . PERICARDIAL WINDOW       Family History  Problem Relation Age of Onset  . Arthritis Mother   . Diabetes Father   . Early death Father        Cardiogenic shock/CAD/MI  . High Cholesterol Father   . Cancer Maternal Grandfather   . Cancer Paternal Grandmother     Social History   Tobacco Use  . Smoking status: Never Smoker  . Smokeless tobacco: Never Used  Substance Use Topics  . Alcohol use: Yes    Comment: social   . Drug use: No   Review of Systems:   Pertinent items are noted in the HPI. Otherwise, ROS is negative.  Objective:   BP 110/72   Pulse 68   Temp 98.7 F (37.1 C) (Oral)   Ht 5\' 8"  (1.727 m)   Wt 234 lb (106.1 kg)   SpO2 99%   BMI 35.58 kg/m   General appearance: alert, cooperative and appears stated age. Head: normocephalic, without obvious abnormality, atraumatic. Neck: no adenopathy,  supple, symmetrical, trachea midline; thyroid not enlarged, symmetric, no tenderness/mass/nodules. Lungs: clear to auscultation bilaterally. Heart: regular rate and rhythm Abdomen: soft, non-tender; no masses,  no organomegaly. Extremities: extremities normal, atraumatic, no cyanosis or edema. Skin: skin color, texture, turgor normal, no rashes or lesions. Lymph: cervical, supraclavicular, and axillary nodes normal; no abnormal inguinal nodes palpated. Neurologic: grossly normal.  Assessment/Plan:   [name] was seen today for annual exam.  Diagnoses and all orders for this visit:  Routine physical examination  Pure hypercholesterolemia -     Comprehensive metabolic panel -     Lipid panel  Obesity (BMI 30-39.9)  Fatigue, unspecified type -     CBC with Differential/Platelet -     TSH  Onychomycosis Comments: NEW. See orders. Orders: -     terbinafine (LAMISIL) 250 MG tablet; Take 1 tablet (250 mg total) by mouth daily.  Irregular menses Comments: Mild intermenstrual spotting. On triphasic. Will change to monophasic. Orders: -     norgestimate-ethinyl estradiol (PREVIFEM) 0.25-35 MG-MCG tablet; Take 1 tablet by mouth daily.  PSVT (paroxysmal supraventricular tachycardia) ([place]) Comments: No concerns.  Chronic idiopathic pericarditis Comments: No concerns.   Patient Counseling: [x]    Nutrition: Stressed importance of moderation in sodium/caffeine intake, saturated fat and cholesterol, caloric balance, sufficient intake of fresh fruits, vegetables, fiber, calcium, iron, and 1 mg of folate supplement per day (  for females capable of pregnancy).  [x]    Stressed the importance of regular exercise.   [x]    Substance Abuse: Discussed cessation/primary prevention of tobacco, alcohol, or other drug use; driving or other dangerous activities under the influence; availability of treatment for abuse.   [x]    Injury prevention: Discussed safety belts, safety helmets, smoke  detector, smoking near bedding or upholstery.   [x]    Sexuality: Discussed sexually transmitted diseases, partner selection, use of condoms, avoidance of unintended pregnancy  and contraceptive alternatives.  [x]    Dental health: Discussed importance of regular tooth brushing, flossing, and dental visits.  [x]    Health maintenance and immunizations reviewed. Please refer to Health maintenance section.   Briscoe Deutscher, DO [place]

## 2018-07-29 ENCOUNTER — Ambulatory Visit (INDEPENDENT_AMBULATORY_CARE_PROVIDER_SITE_OTHER): Payer: BLUE CROSS/BLUE SHIELD | Admitting: Family Medicine

## 2018-07-29 ENCOUNTER — Encounter: Payer: Self-pay | Admitting: Family Medicine

## 2018-07-29 VITALS — BP 110/72 | HR 68 | Temp 98.7°F | Ht 68.0 in | Wt 234.0 lb

## 2018-07-29 DIAGNOSIS — E669 Obesity, unspecified: Secondary | ICD-10-CM | POA: Diagnosis not present

## 2018-07-29 DIAGNOSIS — B351 Tinea unguium: Secondary | ICD-10-CM

## 2018-07-29 DIAGNOSIS — N926 Irregular menstruation, unspecified: Secondary | ICD-10-CM

## 2018-07-29 DIAGNOSIS — R5383 Other fatigue: Secondary | ICD-10-CM

## 2018-07-29 DIAGNOSIS — I471 Supraventricular tachycardia, unspecified: Secondary | ICD-10-CM

## 2018-07-29 DIAGNOSIS — E7800 Pure hypercholesterolemia, unspecified: Secondary | ICD-10-CM | POA: Diagnosis not present

## 2018-07-29 DIAGNOSIS — I319 Disease of pericardium, unspecified: Secondary | ICD-10-CM

## 2018-07-29 DIAGNOSIS — Z0000 Encounter for general adult medical examination without abnormal findings: Secondary | ICD-10-CM

## 2018-07-29 HISTORY — DX: Tinea unguium: B35.1

## 2018-07-29 LAB — COMPREHENSIVE METABOLIC PANEL
ALT: 13 U/L (ref 0–35)
AST: 14 U/L (ref 0–37)
Albumin: 3.8 g/dL (ref 3.5–5.2)
Alkaline Phosphatase: 55 U/L (ref 39–117)
BUN: 12 mg/dL (ref 6–23)
CO2: 29 mEq/L (ref 19–32)
Calcium: 9.3 mg/dL (ref 8.4–10.5)
Chloride: 103 mEq/L (ref 96–112)
Creatinine, Ser: 0.68 mg/dL (ref 0.40–1.20)
GFR: 93.3 mL/min (ref >60.00)
Glucose, Bld: 69 mg/dL — ABNORMAL LOW (ref 70–99)
Potassium: 4.4 mEq/L (ref 3.5–5.1)
Sodium: 138 mEq/L (ref 135–145)
Total Bilirubin: 0.4 mg/dL (ref 0.2–1.2)
Total Protein: 6.6 g/dL (ref 6.0–8.3)

## 2018-07-29 LAB — CBC WITH DIFFERENTIAL/PLATELET
Basophils Absolute: 0.1 10*3/uL (ref 0.0–0.1)
Basophils Relative: 0.7 % (ref 0.0–3.0)
Eosinophils Absolute: 0.2 10*3/uL (ref 0.0–0.7)
Eosinophils Relative: 2.5 % (ref 0.0–5.0)
HCT: 41.7 % (ref 36.0–46.0)
Hemoglobin: 13.8 g/dL (ref 12.0–15.0)
Lymphocytes Relative: 29.6 % (ref 12.0–46.0)
Lymphs Abs: 2.2 10*3/uL (ref 0.7–4.0)
MCHC: 33 g/dL (ref 30.0–36.0)
MCV: 82.9 fl (ref 78.0–100.0)
Monocytes Absolute: 0.4 10*3/uL (ref 0.1–1.0)
Monocytes Relative: 5.8 % (ref 3.0–12.0)
Neutro Abs: 4.5 10*3/uL (ref 1.4–7.7)
Neutrophils Relative %: 61.4 % (ref 43.0–77.0)
Platelets: 273 10*3/uL (ref 150.0–400.0)
RBC: 5.03 Mil/uL (ref 3.87–5.11)
RDW: 12.7 % (ref 11.5–15.5)
WBC: 7.3 10*3/uL (ref 4.0–10.5)

## 2018-07-29 LAB — TSH: TSH: 0.93 u[IU]/mL (ref 0.35–4.50)

## 2018-07-29 LAB — LIPID PANEL
Cholesterol: 238 mg/dL — ABNORMAL HIGH (ref 0–200)
HDL: 57 mg/dL (ref >39.00)
LDL Cholesterol: 150 mg/dL — ABNORMAL HIGH (ref 0–99)
NonHDL: 180.82
Total CHOL/HDL Ratio: 4
Triglycerides: 152 mg/dL — ABNORMAL HIGH (ref 0.0–149.0)
VLDL: 30.4 mg/dL (ref 0.0–40.0)

## 2018-07-29 MED ORDER — NORGESTIMATE-ETH ESTRADIOL 0.25-35 MG-MCG PO TABS: 1.0000 | ORAL_TABLET | Freq: Every day | ORAL | 11 refills | Status: DC

## 2018-07-29 MED ORDER — TERBINAFINE HCL 250 MG PO TABS: 250.0000 mg | ORAL_TABLET | Freq: Every day | ORAL | 0 refills | Status: AC

## 2018-09-01 ENCOUNTER — Encounter: Payer: Self-pay | Admitting: Sports Medicine

## 2018-09-01 ENCOUNTER — Ambulatory Visit: Payer: Self-pay

## 2018-09-01 ENCOUNTER — Ambulatory Visit: Payer: BLUE CROSS/BLUE SHIELD | Admitting: Sports Medicine

## 2018-09-01 VITALS — BP 112/70 | HR 69 | Ht 68.0 in | Wt 236.4 lb

## 2018-09-01 DIAGNOSIS — M25522 Pain in left elbow: Secondary | ICD-10-CM | POA: Diagnosis not present

## 2018-09-01 DIAGNOSIS — S5002XA Contusion of left elbow, initial encounter: Secondary | ICD-10-CM | POA: Diagnosis not present

## 2018-09-01 MED ORDER — DICLOFENAC SODIUM 2 % TD SOLN: 1.0000 "application " | Freq: Two times a day (BID) | TRANSDERMAL | 0 refills | Status: AC

## 2018-09-01 NOTE — Procedures (Signed)
LIMITED MSK ULTRASOUND OF Left elbow Images were obtained and interpreted by myself, Teresa Coombs, DO  Images have been saved and stored to PACS system. Images obtained on: GE S7 Ultrasound machine  FINDINGS:   Olecranon is normal in appearance overall however there is a small area of double lucency at the bone that may be indicative of a prior bony contusion.  The olecranon bursa does have a small amount of fluid within it which is different compared to the contralateral side.  IMPRESSION:  1. Elbow contusion with small olecranon bursitis, traumatic

## 2018-09-01 NOTE — Progress Notes (Signed)
[name]. [name], [name]  [name] - 48 y.o. female MRN [id number]  Date of birth: March 25, 1971  Visit Date: September 01, 2018  PCP: Briscoe Deutscher, DO   Referred by: Briscoe Deutscher, DO  SUBJECTIVE:  Chief Complaint  Patient presents with  . 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  She reports tripping over a step and landing directly on her elbow.  She is continued to have fairly significant pain especially with the right contact since that time.  There was significant bruising and ecchymosis initially but she has never lost any.  She denies any grip strength weakness.  She does type on a regular basis.  REVIEW OF SYSTEMS: No significant nighttime awakenings due to this issue. Denies fevers, chills, recent weight gain or weight loss.  No night sweats.  Pt denies any change in bowel or bladder habits, muscle weakness, numbness or falls associated with this pain. Otherwise 12 point review of systems performed and is negative    HISTORY:  Prior history reviewed and updated per electronic medical record.  Patient Active Problem List   Diagnosis Date Noted  . Onychomycosis 07/29/2018  . HLD (hyperlipidemia) 09/16/2017  . Chronic idiopathic pericarditis 05/13/2017  . PSVT (paroxysmal supraventricular tachycardia) ([place]) 05/13/2017  . Obesity (BMI 30-39.9) 05/13/2017   Social History   Occupational History  . Occupation: Professor at [employer]  . Smoking status: Never Smoker  . Smokeless tobacco: Never Used  Substance and Sexual Activity  . Alcohol use: Yes    Comment: social   . Drug use: No  . Sexual activity: Not on file   Social History   Social History Narrative  . Not on file    Past Medical History:  Diagnosis Date  . Chronic idiopathic pericarditis  05/13/2017  . Obesity (BMI 30-39.9) 05/13/2017  . PSVT (paroxysmal supraventricular tachycardia) ([place]) 05/13/2017     Past Surgical History:  Procedure Laterality Date  . CESAREAN SECTION    . PERICARDIAL WINDOW      family history includes Arthritis in her mother; Cancer in her maternal grandfather and paternal grandmother; Diabetes in her father; Early death in her father; High Cholesterol in her father.  OBJECTIVE:  VS:  HT:5\' 8"  (172.7 cm)   WT:236 lb 6.4 oz (107.2 kg)  BMI:35.95    BP:112/70  HR:69bpm  TEMP: ( )  RESP:98 %   PHYSICAL EXAM: Well-developed, Well-nourished and In no acute distress  Pupils are equal., EOM intact without nystagmus. and No scleral icterus.  Alert & appropriately interactive. and Not depressed or anxious appearing.  Warm and well perfused   Left is overall well aligned.  The left olecranon bursa is slightly more prominent than the right but this is minimal.  There is no significant focal tenderness over the olecranon bursa were posterior aspect of the olecranon.  She has full elbow flexion extension, supination and pronation.  Her strength is intact in all these planes as well.  Sensation in the upper extremity is normal.  Her elbow is ligamentously stable.   ASSESSMENT:   1. Left elbow pain   2. Contusion of left elbow, initial encounter      PROCEDURES:  None  PLAN:  Pertinent additional documentation may be included in corresponding procedure  notes, imaging studies, problem based documentation and patient instructions.  No problem-specific Assessment & Plan notes found for this encounter.   Symptoms are consistent with an elbow contusion and likely traumatic olecranon bursa that is small.  Sample of Pennsaid provided today.  If this is efficacious prescription can be sent in and she will call and let us know if she needs it.  Avoid direct contact cushioning and/or padded elbow support can be considered    Activity modifications  and the importance of avoiding exacerbating activities (limiting pain to no more than a 4 / 10 during or following activity) recommended and discussed.   Discussed red flag symptoms that warrant earlier emergent evaluation and patient voices understanding.     Meds ordered this encounter  Medications  . Diclofenac Sodium (PENNSAID) 2 % SOLN    Sig: Place 1 application onto the skin 2 (two) times daily for 1 day.    Dispense:  8 g    Refill:  0    Lab Orders  No laboratory test(s) ordered today    Imaging Orders     [place] [facility] POCT ULTRASOUND Referral Orders  No referral(s) requested today      Return if symptoms worsen or fail to improve.          Gerda Diss, [facility] Physician

## 2018-09-01 NOTE — Progress Notes (Addendum)
LIMITED MSK ULTRASOUND OF Left elbow Images were obtained and interpreted by myself, Teresa Coombs, DO  Images have been saved and stored to PACS system. Images obtained on: GE S7 Ultrasound machine  FINDINGS:   Olecranon is normal in appearance overall however there is a small area of double lucency at the bone that may be indicative of a prior bony contusion.  The olecranon bursa does have a small amount of fluid within it which is different compared to the contralateral side.  IMPRESSION:  1. Elbow contusion with small olecranon bursitis, traumatic

## 2018-11-05 ENCOUNTER — Telehealth: Payer: Self-pay | Admitting: Cardiology

## 2018-11-05 NOTE — Telephone Encounter (Signed)
New message   [facility] for pt to call and schedule virtual visit with Dr. Curt Bears. Will offer doxemity video if pt has smart phone.

## 2019-02-26 ENCOUNTER — Encounter: Payer: Self-pay | Admitting: Physician Assistant

## 2019-02-26 ENCOUNTER — Ambulatory Visit (INDEPENDENT_AMBULATORY_CARE_PROVIDER_SITE_OTHER): Payer: BC Managed Care – PPO | Admitting: Physician Assistant

## 2019-02-26 ENCOUNTER — Other Ambulatory Visit: Payer: Self-pay

## 2019-02-26 VITALS — Temp 98.7°F | Ht 68.0 in | Wt 240.0 lb

## 2019-02-26 DIAGNOSIS — Z20822 Contact with and (suspected) exposure to covid-19: Secondary | ICD-10-CM

## 2019-02-26 DIAGNOSIS — Z7189 Other specified counseling: Secondary | ICD-10-CM

## 2019-02-26 DIAGNOSIS — Z20828 Contact with and (suspected) exposure to other viral communicable diseases: Secondary | ICD-10-CM

## 2019-02-26 DIAGNOSIS — R6889 Other general symptoms and signs: Secondary | ICD-10-CM | POA: Diagnosis not present

## 2019-02-26 NOTE — Progress Notes (Signed)
Virtual Visit via Video   I connected with [name] on 02/26/19 at 10:40 AM EDT by a video enabled telemedicine application and verified that I am speaking with the correct person using two identifiers. Location patient: Home Location provider: [facility], Office Persons participating in the virtual visit: Aleecia Reinwald, Inda Coke PA-C.  I discussed the limitations of evaluation and management by telemedicine and the availability of in person appointments. The patient expressed understanding and agreed to proceed.  I acted as a Education administrator for [employer], PA-C [employer], LPN  Subjective:   HPI:   Patient is requesting evaluation for possible COVID-19.  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  Patient endorses the following symptoms: Fever >100.22F []   Yes [x]   No []   Unknown Subjective fever (felt feverish) []   Yes [x]   No []   Unknown Chills []   Yes [x]   No []   Unknown Muscle aches (myalgia) []   Yes [x]   No []   Unknown Runny nose (rhinorrhea) []   Yes [x]   No []   Unknown Sore throat [x]   Yes []   No []   Unknown Cough (new onset or worsening of chronic cough) []   Yes [x]   No []   Unknown Shortness of breath (dyspnea) []   Yes [x]   No []   Unknown Nausea or vomiting []   Yes [x]   No []   Unknown Headache []   Yes [x]   No []   Unknown Abdominal pain  []   Yes [x]   No []   Unknown Diarrhea (?3 loose/looser than normal stools/24hr period) []   Yes [x]   No []   Unknown Other, specify:  Treatments tried: none  Patient risk factors: Smoker? []   Current []   Former [x]   Never If female, currently pregnant? []   Yes [x]   No  ROS: See pertinent positives and negatives per HPI.  Patient Active Problem List   Diagnosis Date Noted  . Onychomycosis 07/29/2018  . HLD (hyperlipidemia) 09/16/2017  . Chronic idiopathic pericarditis 05/13/2017  . PSVT (paroxysmal supraventricular tachycardia) ([place]) 05/13/2017  .  Obesity (BMI 30-39.9) 05/13/2017    Social History   Tobacco Use  . Smoking status: Never Smoker  . Smokeless tobacco: Never Used  Substance Use Topics  . Alcohol use: Yes    Comment: social     Current Outpatient Medications:  .  diltiazem (CARDIZEM) 30 MG tablet, Take one tablet by mouth every 6 hours as needed for palpitations., Disp: 30 tablet, Rfl: 2 .  norgestimate-ethinyl estradiol (PREVIFEM) 0.25-35 MG-MCG tablet, Take 1 tablet by mouth daily., Disp: 1 Package, Rfl: 11 .  predniSONE (DELTASONE) 10 MG tablet, Take 10 mg by mouth as needed. Take as needed. , Disp: , Rfl:   No Known Allergies  Objective:   VITALS: Per patient if applicable, see vitals. GENERAL: Alert, appears well and in no acute distress. HEENT: Atraumatic, conjunctiva clear, no obvious abnormalities on inspection of external nose and ears. NECK: Normal movements of the head and neck. CARDIOPULMONARY: No increased WOB. Speaking in clear sentences. I:E ratio WNL.  MS: Moves all visible extremities without noticeable abnormality. PSYCH: Pleasant and cooperative, well-groomed. Speech normal rate and rhythm. Affect is appropriate. Insight and judgement are appropriate. Attention is focused, linear, and appropriate.  NEURO: CN grossly intact. Oriented as arrived to appointment on time with no prompting. Moves both UE equally.  SKIN: No obvious lesions, wounds, erythema, or cyanosis noted on face or hands.  Assessment and Plan:  Naava was seen today for covid-19 symptoms.  Diagnoses and all orders for this visit:  Close Exposure to Covid-19 Virus -     Novel Coronavirus, NAA ([facility])  Advice Given About Covid-19 Virus Infection   Patient has a respiratory illness without signs of acute distress or respiratory compromise at this time. This is likely a viral infection, which can come from a number of respiratory viruses. We are going to send patient for drive-up testing. As a precaution, they have been  advised to remain home until COVID-19 results and then possible further quarantine after that based on results and symptoms. Advised if they experience a "second sickening" or worsening symptoms as the illness progresses, they are to call the office for further instructions or seek emergent evaluation for any severe symptoms.   . Reviewed expectations re: course of current medical issues. . Discussed self-management of symptoms. . Outlined signs and symptoms indicating need for more acute intervention. . Patient verbalized understanding and all questions were answered. Marland Kitchen Health Maintenance issues including appropriate healthy diet, exercise, and smoking avoidance were discussed with patient. . See orders for this visit as documented in the electronic medical record.  I discussed the assessment and treatment plan with the patient. The patient was provided an opportunity to ask questions and all were answered. The patient agreed with the plan and demonstrated an understanding of the instructions.   The patient was advised to call back or seek an in-person evaluation if the symptoms worsen or if the condition fails to improve as anticipated.   CMA or LPN served as scribe during this visit. History, Physical, and Plan performed by medical provider. The above documentation has been reviewed and is accurate and complete.   [place], [place] 02/26/2019

## 2019-02-28 LAB — NOVEL CORONAVIRUS, NAA: SARS-CoV-2, NAA: NOT DETECTED

## 2019-04-03 ENCOUNTER — Telehealth: Payer: BC Managed Care – PPO | Admitting: Physician Assistant

## 2019-04-03 ENCOUNTER — Other Ambulatory Visit: Payer: Self-pay

## 2019-04-03 DIAGNOSIS — Z20828 Contact with and (suspected) exposure to other viral communicable diseases: Secondary | ICD-10-CM

## 2019-04-03 DIAGNOSIS — Z20822 Contact with and (suspected) exposure to covid-19: Secondary | ICD-10-CM

## 2019-04-03 MED ORDER — PROMETHAZINE-DM 6.25-15 MG/5ML PO SYRP: 5.0000 mL | ORAL_SOLUTION | Freq: Four times a day (QID) | ORAL | 0 refills | Status: DC | PRN

## 2019-04-03 NOTE — Progress Notes (Signed)
E-Visit for Corona Virus Screening   Your current symptoms could be consistent with the coronavirus.  Many health care providers can now test patients at their office but not all are.  [place] has multiple testing sites. For information on our COVID testing locations and hours go to [email or web address]  Please quarantine yourself while awaiting your test results.  We are enrolling you in our [place] for [place] . Daily you will receive a questionnaire within the [place] website. Our COVID 19 response team willl be monitoriing your responses daily.    COVID-19 is a respiratory illness with symptoms that are similar to the flu. Symptoms are typically mild to moderate, but there have been cases of severe illness and death due to the virus. The following symptoms may appear 2-14 days after exposure: . Fever . Cough . Shortness of breath or difficulty breathing . Chills . Repeated shaking with chills . Muscle pain . Headache . Sore throat . New loss of taste or smell . Fatigue . Congestion or runny nose . Nausea or vomiting . Diarrhea  It is vitally important that if you feel that you have an infection such as this virus or any other virus that you stay home and away from places where you may spread it to others.  You should self-quarantine for 14 days if you have symptoms that could potentially be coronavirus or have been in close contact a with a person diagnosed with COVID-19 within the last 2 weeks. You should avoid contact with people age 46 and older.   You should wear a mask or cloth face covering over your nose and mouth if you must be around other people or animals, including pets (even at home). Try to stay at least 6 feet away from other people. This will protect the people around you.  You can use medication such as A prescription cough medication called Phenergan DM 6.25 mg/15 mg. You make take one teaspoon / 5 ml every 4-6 hours as  needed for cough  You may also take acetaminophen (Tylenol) as needed for fever.   Reduce your risk of any infection by using the same precautions used for avoiding the common cold or flu:  Marland Kitchen Wash your hands often with soap and warm water for at least 20 seconds.  If soap and water are not readily available, use an alcohol-based hand sanitizer with at least 60% alcohol.  . If coughing or sneezing, cover your mouth and nose by coughing or sneezing into the elbow areas of your shirt or coat, into a tissue or into your sleeve (not your hands). . Avoid shaking hands with others and consider head nods or verbal greetings only. . Avoid touching your eyes, nose, or mouth with unwashed hands.  . Avoid close contact with people who are sick. . Avoid places or events with large numbers of people in one location, like concerts or sporting events. . Carefully consider travel plans you have or are making. . If you are planning any travel outside or inside the [place], visit the CDC's Travelers' Health webpage for the latest health notices. . If you have some symptoms but not all symptoms, continue to monitor at home and seek medical attention if your symptoms worsen. . If you are having a medical emergency, call 911.  HOME CARE . Only take medications as instructed by your medical team. . Drink plenty of fluids and get plenty of rest. . A steam or ultrasonic humidifier can help if you  have congestion.   GET HELP RIGHT AWAY IF YOU HAVE EMERGENCY WARNING SIGNS** FOR COVID-19. If you or someone is showing any of these signs seek emergency medical care immediately. Call 911 or proceed to your closest emergency facility if: . You develop worsening high fever. . Trouble breathing . Bluish lips or face . Persistent pain or pressure in the chest . New confusion . Inability to wake or stay awake . You cough up blood. . Your symptoms become more severe  **This list is not all possible symptoms. Contact your  medical provider for any symptoms that are sever or concerning to you.   MAKE SURE YOU   Understand these instructions.  Will watch your condition.  Will get help right away if you are not doing well or get worse.  Your e-visit answers were reviewed by a board certified advanced clinical practitioner to complete your personal care plan.  Depending on the condition, your plan could have included both over the counter or prescription medications.  If there is a problem please reply once you have received a response from your provider.  Your safety is important to [place].  If you have drug allergies check your prescription carefully.    You can use MyChart to ask questions about today's visit, request a non-urgent call back, or ask for a work or school excuse for 24 hours related to this e-Visit. If it has been greater than 24 hours you will need to follow up with your provider, or enter a new e-Visit to address those concerns. You will get an e-mail in the next two days asking about your experience.  I hope that your e-visit has been valuable and will speed your recovery. Thank you for using e-visits.   Greater than 5 minutes, yet less than 10 minutes of time have been spent researching, coordinating, and implementing care for this patient today

## 2019-04-04 LAB — NOVEL CORONAVIRUS, NAA: SARS-CoV-2, NAA: DETECTED — AB

## 2019-04-14 ENCOUNTER — Ambulatory Visit: Payer: BLUE CROSS/BLUE SHIELD | Admitting: Cardiology

## 2019-04-23 ENCOUNTER — Encounter: Payer: Self-pay | Admitting: Physician Assistant

## 2019-05-04 MED ORDER — DILTIAZEM HCL 30 MG PO TABS: ORAL_TABLET | ORAL | 6 refills | Status: DC

## 2019-05-21 DIAGNOSIS — M25774 Osteophyte, right foot: Secondary | ICD-10-CM | POA: Diagnosis not present

## 2019-05-21 DIAGNOSIS — L600 Ingrowing nail: Secondary | ICD-10-CM | POA: Diagnosis not present

## 2019-05-22 ENCOUNTER — Encounter: Payer: Self-pay | Admitting: Physician Assistant

## 2019-05-25 ENCOUNTER — Other Ambulatory Visit: Payer: Self-pay

## 2019-05-26 ENCOUNTER — Encounter: Payer: Self-pay | Admitting: Physician Assistant

## 2019-05-26 ENCOUNTER — Ambulatory Visit (INDEPENDENT_AMBULATORY_CARE_PROVIDER_SITE_OTHER): Payer: BC Managed Care – PPO | Admitting: Physician Assistant

## 2019-05-26 VITALS — BP 120/72 | HR 67 | Temp 98.0°F | Ht 68.0 in | Wt 239.5 lb

## 2019-05-26 DIAGNOSIS — N926 Irregular menstruation, unspecified: Secondary | ICD-10-CM | POA: Diagnosis not present

## 2019-05-26 DIAGNOSIS — B351 Tinea unguium: Secondary | ICD-10-CM

## 2019-05-26 MED ORDER — NORGESTIMATE-ETH ESTRADIOL 0.25-35 MG-MCG PO TABS: 1.0000 | ORAL_TABLET | Freq: Every day | ORAL | 11 refills | Status: DC

## 2019-05-26 NOTE — Patient Instructions (Signed)
It was great to see you!  Keep me posted on your toe!  Take care,  Inda Coke PA-C

## 2019-05-26 NOTE — Progress Notes (Signed)
[name] is a 48 y.o. female here for a new problem.  I acted as a Education administrator for [employer], PA-C Pamela Pickler, LPN  History of Present Illness:   Chief Complaint  Patient presents with  . Check Rt great toe    HPI   Great toe Pt had Rt great toe nail removed last Thursday 11/19. Pt wants it checked to make sure healing okay. She went to a commercial independent practice for the toenail removal, does not feel safe returning there due to their lack of Covid precautions.  She states that her toenail was removed because she had some potential toenail fungus. Overall she feels like her toe is healing well, denies any discharge from the nailbed, any numbness or tingling, any significant pain.  Irregular periods She continues on her OCPs and needs a refill today.   This birth control works well for her.  Denies any concerns for blood clots.  Past Medical History:  Diagnosis Date  . Chronic idiopathic pericarditis 05/13/2017  . Obesity (BMI 30-39.9) 05/13/2017  . PSVT (paroxysmal supraventricular tachycardia) ([place]) 05/13/2017     Social History   Socioeconomic History  . Marital status: Married    Spouse name: Not on file  . Number of children: 1  . Years of education: Not on file  . Highest education level: Not on file  Occupational History  . Occupation: Professor at [employer]  . Financial resource strain: Not on file  . Food insecurity    Worry: Not on file    Inability: Not on file  . Transportation needs    Medical: Not on file    Non-medical: Not on file  Tobacco Use  . Smoking status: Never Smoker  . Smokeless tobacco: Never Used  Substance and Sexual Activity  . Alcohol use: Yes    Comment: social   . Drug use: No  . Sexual activity: Not on file  Lifestyle  . Physical activity    Days per week: Not on file    Minutes per session: Not on file  . Stress: Not on file  Relationships  . Social Herbalist on phone: Not  on file    Gets together: Not on file    Attends religious service: Not on file    Active member of club or organization: Not on file    Attends meetings of clubs or organizations: Not on file    Relationship status: Not on file  . Intimate partner violence    Fear of current or ex partner: Not on file    Emotionally abused: Not on file    Physically abused: Not on file    Forced sexual activity: Not on file  Other Topics Concern  . Not on file  Social History Narrative  . Not on file    Past Surgical History:  Procedure Laterality Date  . CESAREAN SECTION    . PERICARDIAL WINDOW      Family History  Problem Relation Age of Onset  . Arthritis Mother   . Diabetes Father   . Early death Father        Cardiogenic shock/CAD/MI  . High Cholesterol Father   . Cancer Maternal Grandfather   . Cancer Paternal Grandmother     No Known Allergies  Current Medications:   Current Outpatient Medications:  .  amoxicillin-clavulanate (AUGMENTIN) 500-125 MG tablet, Take 1 tablet by mouth 2 (two) times daily., Disp: , Rfl:  .  diltiazem (CARDIZEM) 30 MG tablet, Take one tablet by mouth every 6 hours as needed for palpitations., Disp: 30 tablet, Rfl: 6 .  norgestimate-ethinyl estradiol (PREVIFEM) 0.25-35 MG-MCG tablet, Take 1 tablet by mouth daily., Disp: 1 Package, Rfl: 11 .  predniSONE (DELTASONE) 10 MG tablet, Take 10 mg by mouth as needed. Take as needed. , Disp: , Rfl:    Review of Systems:   ROS Negative unless otherwise specified per HPI.  Vitals:   Vitals:   05/26/19 1347  BP: 120/72  Pulse: 67  Temp: 98 F (36.7 C)  TempSrc: Temporal  SpO2: 97%  Weight: 239 lb 8 oz (108.6 kg)  Height: 5\' 8"  (1.727 m)     Body mass index is 36.42 kg/m.  Physical Exam:   Physical Exam Constitutional:      Appearance: She is well-developed.  HENT:     Head: Normocephalic and atraumatic.  Eyes:     Conjunctiva/sclera: Conjunctivae normal.  Neck:     Musculoskeletal: Normal  range of motion and neck supple.  Pulmonary:     Effort: Pulmonary effort is normal.  Musculoskeletal: Normal range of motion.  Skin:    General: Skin is warm and dry.     Comments: Right great toe without nail, no erythema, tenderness, visible abnormality other than nail missing  Neurological:     Mental Status: She is alert and oriented to person, place, and time.     Comments: Normal sensation to right foot per patient  Psychiatric:        Behavior: Behavior normal.        Thought Content: Thought content normal.        Judgment: Judgment normal.     Assessment and Plan:   [name] was seen today for check rt great toe.  Diagnoses and all orders for this visit:  Onychomycosis Toenail removal site looks great.  No evidence of infection on my exam.  No need for further intervention at this time.  If she has any issues, we will refer to a second podiatrist for further evaluation and treatment.  Irregular menses Well-controlled, will refill her medication per her request. Orders: -     norgestimate-ethinyl estradiol (PREVIFEM) 0.25-35 MG-MCG tablet; Take 1 tablet by mouth daily.   . Reviewed expectations re: course of current medical issues. . Discussed self-management of symptoms. . Outlined signs and symptoms indicating need for more acute intervention. . Patient verbalized understanding and all questions were answered. . See orders for this visit as documented in the electronic medical record. . Patient received an After-Visit Summary.  CMA or LPN served as scribe during this visit. History, Physical, and Plan performed by medical provider. The above documentation has been reviewed and is accurate and complete.   Inda Coke, PA-C

## 2019-06-29 ENCOUNTER — Encounter: Payer: Self-pay | Admitting: *Deleted

## 2019-07-06 ENCOUNTER — Telehealth: Payer: Self-pay | Admitting: *Deleted

## 2019-07-06 NOTE — Telephone Encounter (Signed)
YOUR CARDIOLOGY TEAM HAS ARRANGED FOR AN E-VISIT FOR YOUR APPOINTMENT - PLEASE REVIEW IMPORTANT INFORMATION BELOW SEVERAL DAYS PRIOR TO YOUR APPOINTMENT  Due to the recent COVID-19 pandemic, we are transitioning in-person office visits to tele-medicine visits in an effort to decrease unnecessary exposure to our patients, their families, and staff. These visits are billed to your insurance just like a normal visit is. We also encourage you to sign up for MyChart if you have not already done so. You will need a smartphone if possible. For patients that do not have this, we can still complete the visit using a regular telephone but do prefer a smartphone to enable video when possible. You may have a family member that lives with you that can help. If possible, we also ask that you have a blood pressure cuff and scale at home to measure your blood pressure, heart rate and weight prior to your scheduled appointment. Patients with clinical needs that need an in-person evaluation and testing will still be able to come to the office if absolutely necessary. If you have any questions, feel free to call our office.     YOUR PROVIDER WILL BE USING THE FOLLOWING PLATFORM TO COMPLETE YOUR VISIT: Staff: Please delete this text and fill in MyChart/Doximity/Doxy.Me Pt gave verbal consent for virtual appt 07/07/2019  . IF USING MYCHART - How to Download the MyChart App to Your SmartPhone   - If Apple, go to [employer] and type in MyChart in the search bar and download the app. If Android, ask patient to go to [employer] and type in [place] in the search bar and download the app. The app is free but as with any other app downloads, your phone may require you to verify saved payment information or Apple/Android password.  - You will need to then log into the app with your MyChart username and password, and select [facility] as your healthcare provider to link the account.  - When it is time for your visit, go to the  MyChart app, find appointments, and click Begin Video Visit. Be sure to Select Allow for your device to access the Microphone and Camera for your visit. You will then be connected, and your provider will be with you shortly.  **If you have any issues connecting or need assistance, please contact MyChart service desk [phone])**  **If using a computer, in order to ensure the best quality for your visit, you will need to use either of the following Internet Browsers: Insurance underwriter or [employer]**  . IF USING DOXIMITY or [email or web address].ME - The staff will give you instructions on receiving your link to join the meeting the day of your visit.      THE DAY OF YOUR APPOINTMENT  Approximately 15 minutes prior to your scheduled appointment, you will receive a telephone call from one of [facility] team - your caller ID may say "Unknown caller."  Our staff will confirm medications, vital signs for the day and any symptoms you may be experiencing. Please have this information available prior to the time of visit start. It may also be helpful for you to have a pad of paper and pen handy for any instructions given during your visit. They will also walk you through joining the smartphone meeting if this is a video visit.    CONSENT FOR TELE-HEALTH VISIT - PLEASE REVIEW  I hereby voluntarily request, consent and authorize [facility] and its employed or contracted physicians, Engineer, materials, nurse  practitioners or other licensed health care professionals (the Practitioner), to provide me with telemedicine health care services (the "Services") as deemed necessary by the treating Practitioner. I acknowledge and consent to receive the Services by the Practitioner via telemedicine. I understand that the telemedicine visit will involve communicating with the Practitioner through live audiovisual communication technology and the disclosure of certain medical information by electronic transmission. I  acknowledge that I have been given the opportunity to request an in-person assessment or other available alternative prior to the telemedicine visit and am voluntarily participating in the telemedicine visit.  I understand that I have the right to withhold or withdraw my consent to the use of telemedicine in the course of my care at any time, without affecting my right to future care or treatment, and that the Practitioner or I may terminate the telemedicine visit at any time. I understand that I have the right to inspect all information obtained and/or recorded in the course of the telemedicine visit and may receive copies of available information for a reasonable fee.  I understand that some of the potential risks of receiving the Services via telemedicine include:  Marland Kitchen Delay or interruption in medical evaluation due to technological equipment failure or disruption; . Information transmitted may not be sufficient (e.g. poor resolution of images) to allow for appropriate medical decision making by the Practitioner; and/or  . In rare instances, security protocols could fail, causing a breach of personal health information.  Furthermore, I acknowledge that it is my responsibility to provide information about my medical history, conditions and care that is complete and accurate to the best of my ability. I acknowledge that Practitioner's advice, recommendations, and/or decision may be based on factors not within their control, such as incomplete or inaccurate data provided by me or distortions of diagnostic images or specimens that may result from electronic transmissions. I understand that the practice of medicine is not an exact science and that Practitioner makes no warranties or guarantees regarding treatment outcomes. I acknowledge that I will receive a copy of this consent concurrently upon execution via email to the email address I last provided but may also request a printed copy by calling the office of  [facility].    I understand that my insurance will be billed for this visit.   I have read or had this consent read to me. . I understand the contents of this consent, which adequately explains the benefits and risks of the Services being provided via telemedicine.  . I have been provided ample opportunity to ask questions regarding this consent and the Services and have had my questions answered to my satisfaction. . I give my informed consent for the services to be provided through the use of telemedicine in my medical care  By participating in this telemedicine visit I agree to the above.

## 2019-07-07 ENCOUNTER — Telehealth (INDEPENDENT_AMBULATORY_CARE_PROVIDER_SITE_OTHER): Payer: BC Managed Care – PPO | Admitting: Physician Assistant

## 2019-07-07 ENCOUNTER — Other Ambulatory Visit: Payer: Self-pay

## 2019-07-07 ENCOUNTER — Encounter: Payer: Self-pay | Admitting: Physician Assistant

## 2019-07-07 VITALS — HR 68 | Ht 68.0 in | Wt 234.0 lb

## 2019-07-07 DIAGNOSIS — I471 Supraventricular tachycardia: Secondary | ICD-10-CM | POA: Diagnosis not present

## 2019-07-07 DIAGNOSIS — I319 Disease of pericardium, unspecified: Secondary | ICD-10-CM

## 2019-07-07 NOTE — Progress Notes (Signed)
Virtual Visit via Telephone Note   This visit type was conducted due to national recommendations for restrictions regarding the COVID-19 Pandemic (e.g. social distancing) in an effort to limit this patient's exposure and mitigate transmission in our community.  Due to her co-morbid illnesses, this patient is at least at moderate risk for complications without adequate follow up.  This format is felt to be most appropriate for this patient at this time.  The patient did not have access to video technology/had technical difficulties with video requiring transitioning to audio format only (telephone).  All issues noted in this document were discussed and addressed.  No physical exam could be performed with this format.  Please refer to the patient's chart for her  consent to telehealth for [facility].   Date:  07/07/2019   ID:  [name], DOB 1971/03/26, MRN [id number]  Patient Location: Home Provider Location: Home  PCP:  Patient, No Pcp Per  Cardiologist:  Lauree Chandler, MD  Electrophysiologist:  Will Meredith Leeds, MD   Evaluation Performed:  Follow-Up Visit  Chief Complaint:   History of Present Illness:    [name] is a 49 y.o. female with history of idiopathic recurrent pericarditis, PSVT and recent Covid seen for follow-up.  Patient has longstanding history of pericarditis/pericardial effusion dating back to 2008 requiring pericardial window at [facility].  She was treated with methotrexate for some time.  She was diagnosed with PSVT in [place]. Cardiac monitor May 2019 with runs of [name], likely AVNRT. She was seen in our EP clinic by Dr. Lennie Odor and given minimal symptoms she continued with plan to use diltiazem as needed. Ablation was discussed and would be planned if symptoms worsen.   Seen today for follow-up.  She had a mild case of Covid October 2020.  She was not hospitalized.  Since Covid, her intensity of PSVT has increased.  Her palpitation last for 3 minutes  usually however more recently lasting for greater than 10 minutes.  Longest episode 30 minutes few weeks ago.  Occasionally feels dizziness with elevated heart rate.  No associated shortness of breath or chest pain.  She denies syncope.  No change in diet or caffeine intake.  She has been exercising without any issue.  She never took her as needed Cardizem.  The patient does not have symptoms concerning for COVID-19 infection (fever, chills, cough, or new shortness of breath).    Past Medical History:  Diagnosis Date  . Chronic idiopathic pericarditis 05/13/2017  . HLD (hyperlipidemia) 09/16/2017  . Obesity (BMI 30-39.9) 05/13/2017  . Onychomycosis 07/29/2018  . PSVT (paroxysmal supraventricular tachycardia) ([place]) 05/13/2017   Past Surgical History:  Procedure Laterality Date  . CESAREAN SECTION    . PERICARDIAL WINDOW       Current Meds  Medication Sig  . diltiazem (CARDIZEM) 30 MG tablet Take one tablet by mouth every 6 hours as needed for palpitations.  . norgestimate-ethinyl estradiol (PREVIFEM) 0.25-35 MG-MCG tablet Take 1 tablet by mouth daily.  . predniSONE (DELTASONE) 10 MG tablet Take 10 mg by mouth as needed (idiopathic pericarditis). Take as needed.      Allergies:   Patient has no known allergies.   Social History   Tobacco Use  . Smoking status: Never Smoker  . Smokeless tobacco: Never Used  Substance Use Topics  . Alcohol use: Yes    Comment: social   . Drug use: No     Family Hx: The patient's family history includes Arthritis in her mother; Cancer  in her maternal grandfather and paternal grandmother; Diabetes in her father; Early death in her father; High Cholesterol in her father.  ROS:   Please see the history of present illness.    All other systems reviewed and are negative.   Prior CV studies:   The following studies were reviewed today:  Echo 10/2017 Study Conclusions  - Left ventricle: The cavity size was normal. Systolic function was    normal. The estimated ejection fraction was in the range of 55%   to 60%. Wall motion was normal; there were no regional wall   motion abnormalities. Left ventricular diastolic function   parameters were normal. - Atrial septum: No defect or patent foramen ovale was identified.  Labs/Other Tests and Data Reviewed:    EKG:  An ECG dated 10/2017 was personally reviewed today and demonstrated:  NSR at rate of 60 bpm  Recent Labs: 07/29/2018: ALT 13; BUN 12; Creatinine, Ser 0.68; Hemoglobin 13.8; Platelets 273.0; Potassium 4.4; Sodium 138; TSH 0.93   Recent Lipid Panel Lab Results  Component Value Date/Time   CHOL 238 (H) 07/29/2018 08:08 AM   TRIG 152.0 (H) 07/29/2018 08:08 AM   HDL 57.00 07/29/2018 08:08 AM   CHOLHDL 4 07/29/2018 08:08 AM   LDLCALC 150 (H) 07/29/2018 08:08 AM    Wt Readings from Last 3 Encounters:  07/07/19 234 lb (106.1 kg)  05/26/19 239 lb 8 oz (108.6 kg)  02/26/19 240 lb (108.9 kg)     Objective:    Vital Signs:  Pulse 68   Ht 5\' 8"  (1.727 m)   Wt 234 lb (106.1 kg)   BMI 35.58 kg/m    VITAL SIGNS:  reviewed GEN:  no acute distress PSYCH:  normal affect  ASSESSMENT & PLAN:    1. [name] Frequency has been same but intensity has increased since Covid.  Never required to take as needed Cardizem.  I have at length discussion regarding treatment options of as needed Cardizem, long-acting Cardizem at low-dose or discussion of ablation.  She decided to continue to monitor symptoms and take as needed Cardizem.  She will call us or go to ER if worsening symptoms.  2.  History of recurrent pericarditis -No recent episode.  COVID-19 Education: The signs and symptoms of COVID-19 were discussed with the patient and how to seek care for testing (follow up with PCP or arrange E-visit).  The importance of social distancing was discussed today.  Time:   Today, I have spent 22 minutes with the patient with telehealth technology discussing the above problems.      Medication Adjustments/Labs and Tests Ordered: Current medicines are reviewed at length with the patient today.  Concerns regarding medicines are outlined above.   Tests Ordered: No orders of the defined types were placed in this encounter.   Medication Changes: No orders of the defined types were placed in this encounter.   Follow Up:  Virtual Visit  in 3 month(s)  Signed, Leanor Kail, [place]  07/07/2019 8:05 AM    [place]

## 2019-07-07 NOTE — Patient Instructions (Addendum)
Medication Instructions:  Your physician recommends that you continue on your current medications as directed. Please refer to the Current Medication list given to you today.  *If you need a refill on your cardiac medications before your next appointment, please call your pharmacy*  Lab Work: None ordered  If you have labs (blood work) drawn today and your tests are completely normal, you will receive your results only by: Marland Kitchen MyChart Message (if you have MyChart) OR . A paper copy in the mail If you have any lab test that is abnormal or we need to change your treatment, we will call you to review the results.  Testing/Procedures: None ordered  Follow-Up: At [facility], you and your health needs are our priority.  As part of our continuing mission to provide you with exceptional heart care, we have created designated Provider Care Teams.  These Care Teams include your primary Cardiologist (physician) and Advanced Practice Providers (APPs -  Physician Assistants and Nurse Practitioners) who all work together to provide you with the care you need, when you need it.  Your next appointment:   2-3 Months (I will have someone reach out to you with this appointment)  The format for your next appointment:   Virtual  Provider:   You may see Will Meredith Leeds, MD

## 2019-07-08 ENCOUNTER — Telehealth: Payer: BC Managed Care – PPO | Admitting: Physician Assistant

## 2019-07-31 ENCOUNTER — Other Ambulatory Visit: Payer: Self-pay | Admitting: Family Medicine

## 2019-07-31 DIAGNOSIS — Z1231 Encounter for screening mammogram for malignant neoplasm of breast: Secondary | ICD-10-CM

## 2019-08-04 ENCOUNTER — Other Ambulatory Visit: Payer: Self-pay

## 2019-08-04 ENCOUNTER — Encounter: Payer: Self-pay | Admitting: Family Medicine

## 2019-08-04 ENCOUNTER — Ambulatory Visit (INDEPENDENT_AMBULATORY_CARE_PROVIDER_SITE_OTHER): Payer: BC Managed Care – PPO | Admitting: Family Medicine

## 2019-08-04 ENCOUNTER — Ambulatory Visit
Admission: RE | Admit: 2019-08-04 | Discharge: 2019-08-04 | Disposition: A | Discharge status: Home / Self Care | Payer: BC Managed Care – PPO | Source: Ambulatory Visit

## 2019-08-04 VITALS — BP 110/62 | HR 67 | Temp 97.6°F | Ht 68.0 in | Wt 228.0 lb

## 2019-08-04 DIAGNOSIS — Z3041 Encounter for surveillance of contraceptive pills: Secondary | ICD-10-CM

## 2019-08-04 DIAGNOSIS — I319 Disease of pericardium, unspecified: Secondary | ICD-10-CM

## 2019-08-04 DIAGNOSIS — Z0000 Encounter for general adult medical examination without abnormal findings: Secondary | ICD-10-CM

## 2019-08-04 DIAGNOSIS — E669 Obesity, unspecified: Secondary | ICD-10-CM

## 2019-08-04 DIAGNOSIS — I471 Supraventricular tachycardia: Secondary | ICD-10-CM

## 2019-08-04 DIAGNOSIS — Z1231 Encounter for screening mammogram for malignant neoplasm of breast: Secondary | ICD-10-CM | POA: Diagnosis not present

## 2019-08-04 DIAGNOSIS — S90222A Contusion of left lesser toe(s) with damage to nail, initial encounter: Secondary | ICD-10-CM

## 2019-08-04 NOTE — Progress Notes (Signed)
Subjective  Chief Complaint  Patient presents with  . Transitions Of Care    TOC from Dr. Juleen China  . Annual Exam    Patient is not fasting. Patient wakes up betwen 2-4 in the morning. She does not have any problem going to sleep.  . Right toe    No pain. Black spot og right side great toe.    HPI: [name] is a 49 y.o. female who presents to [facility] [place] today for a Female Wellness Visit. She also has the concerns and/or needs as listed above in the chief complaint. These will be addressed in addition to the Health Maintenance Visit.  TOC: reviewed records from cardiology and [facility] regarding heart issues. Reviewed labs from prior pcp.  Pleasant 76 yo married mother of a 75 yo daughter, professor at [employer], higher education. Wellness Visit: annual visit with health maintenance review and exam without Pap   HM: mammo done today. Awaiting results. Pap up to date. imms up to date. Losing weight with [employer]. Chronic disease f/u and/or acute problem visit: (deemed necessary to be done in addition to the wellness visit):  Uses OCP for birth control. Regular menses. No AEs.   Idiopathic chronic pericarditis.  Diagnosed back in 2008.  Had significant pericardial effusion requiring pericardial window.  Over the next 4 years had symptomatic recurrences requiring prednisone but none since 2012.  She will get mild flares improved with NSAIDs.  Currently with mild flare noticed as chest pain on lying on her back.  Stressors and fatigue are triggers.  No fevers.  Rheumatologic and Cardiologic eval have been done and all have been negative.  Reviewed recent cardiology visits from cardiologist here.  Echocardiogram was done in May 2019: I reviewed this report.  Essentially normal heart function and no valvular disease or effusions.  History of PSVT currently symptomatic to 3 times weekly.  Diagnosed by monitors.  No history of coronary disease.  Positive premature family  history of coronary disease, her dad passed at age 65 due to heart disease.  Obesity: Working with [employer], has lost 15 pounds in the last 6 weeks.  New problem, insomnia: Has noticed over the last several weeks she is awakening in the middle of the night and at times has mild trouble falling back asleep.  No prior history of insomnia.  She does admit to being worried and anxious regarding the prolonged coronavirus pandemic.  She worries about the loss of her daughter.  She did have coronavirus back in October and feels that she is fully recovered but wonders if her sleep issues are related to the past infection.  She denies racing thoughts, symptoms of anxiety or depression.  No physical complaints.  Has not tried any medications to help.  Has had trauma to her right great toenail requiring removal by podiatrist.  They entertained the possibility of fungal infection but this has since been clarified.  She notes a dark spot newly growing toenail would like it checked.  No recent trauma.  The 10-year ASCVD risk score Mikey Bussing DC Brooke Bonito., et al., 2013) is: 0.8%   Values used to calculate the score:     Age: 35 years     Sex: Female     Is Non-Hispanic African American: No     Diabetic: No     Tobacco smoker: No     Systolic Blood Pressure: 811 mmHg     Is BP treated: No  HDL Cholesterol: 57 mg/dL     Total Cholesterol: 238 mg/dL  Assessment  1. Annual physical exam   2. Chronic idiopathic pericarditis, unspecified complication status   3. PSVT (paroxysmal supraventricular tachycardia) (HCC)   4. Obesity (BMI 30-39.9)   5. Oral contraceptive pill surveillance   6. Contusion of toenail of left foot, initial encounter      Plan   Female Wellness Visit:  Age appropriate Health Maintenance and Prevention measures were discussed with patient. Included topics are cancer screening recommendations, ways to keep healthy (see AVS) including dietary and exercise recommendations, regular eye and  dental care, use of seat belts, and avoidance of moderate alcohol use and tobacco use.  Screens are up-to-date  BMI: discussed patient's BMI and encouraged positive lifestyle modifications to help get to or maintain a target BMI.  HM needs and immunizations were addressed and ordered. See below for orders. See HM and immunization section for updates.  Routine labs and screening tests ordered including cmp, cbc and lipids where appropriate.  Discussed recommendations regarding Vit D and calcium supplementation (see AVS)  Chronic disease management visit and/or acute problem visit:  Pericarditis, chronic idiopathic: Will monitor.  Currently stable.  PSVT: Symptomatic.  Discussed possibility of low-dose beta-blocker or calcium channel blocker to help alleviate her symptoms.  It does cause her some physical lightheadedness at times and persistent worry.  She will follow-up with cardiology as discussed.  Obesity: Encouraged to continue with weight loss.  We will recheck lab work today including lipids.  OCP use: Stable refill.  Insomnia: New onset, likely related to stressors and worry around sleep.  See after visit summary for recommendations.  Sleep hygiene discussed.  Trial melatonin if needed.  Return if not improving  Follow up: Return in about 1 year (around 08/03/2020) for complete physical.  Orders Placed This Encounter  Procedures  . CBC with Differential/Platelet  . Comprehensive metabolic panel  . Lipid panel  . TSH   No orders of the defined types were placed in this encounter.     Lifestyle: Body mass index is 34.67 kg/m. Wt Readings from Last 3 Encounters:  08/04/19 228 lb (103.4 kg)  07/07/19 234 lb (106.1 kg)  05/26/19 239 lb 8 oz (108.6 kg)     Patient Active Problem List   Diagnosis Date Noted  . Oral contraceptive pill surveillance 08/04/2019  . Chronic idiopathic pericarditis 05/13/2017  . PSVT (paroxysmal supraventricular tachycardia) ([place]) 05/13/2017  .  Obesity (BMI 30-39.9) 05/13/2017   Health Maintenance  Topic Date Due  . MAMMOGRAM  08/03/2021  . PAP SMEAR-Modifier  05/13/2022  . TETANUS/TDAP  08/19/2022  . INFLUENZA VACCINE  Completed  . HIV Screening  Discontinued   Immunization History  Administered Date(s) Administered  . Influenza, Quadrivalent, Recombinant, Inj, Pf 05/17/2019  . Influenza,inj,Quad PF,6+ Mos 04/18/2017, 04/18/2018  . MMR 06/26/2018  . Tdap 08/19/2012   We updated and reviewed the patient's past history in detail and it is documented below. Allergies: Patient has No Known Allergies. Past Medical History Patient  has a past medical history of Chronic idiopathic pericarditis (05/13/2017), HLD (hyperlipidemia) (09/16/2017), Obesity (BMI 30-39.9) (05/13/2017), Onychomycosis (07/29/2018), and PSVT (paroxysmal supraventricular tachycardia) ([place]) (05/13/2017). Past Surgical History Patient  has a past surgical history that includes Pericardial window and Cesarean section. Family History: Patient family history includes Arthritis in her mother; Cancer in her maternal grandfather and paternal grandmother; Diabetes in her father; Early death in her father; High Cholesterol in her father. Social History:  Patient  reports that she has never smoked. She has never used smokeless tobacco. She reports current alcohol use. She reports that she does not use drugs.  Review of Systems: Constitutional: negative for fever or malaise Ophthalmic: negative for photophobia, double vision or loss of vision Cardiovascular: negative for chest pain, dyspnea on exertion, or new LE swelling Respiratory: negative for SOB or persistent cough Gastrointestinal: negative for abdominal pain, change in bowel habits or melena Genitourinary: negative for dysuria or gross hematuria, no abnormal uterine bleeding or disharge Musculoskeletal: negative for new gait disturbance or muscular weakness Integumentary: negative for new or persistent rashes,  no breast lumps Neurological: negative for TIA or stroke symptoms Psychiatric: negative for SI or delusions Allergic/Immunologic: negative for hives  Patient Care Team    Relationship Specialty Notifications Start End  Leamon Arnt, MD PCP - General Family Medicine  08/04/19   Constance Haw, MD Consulting Physician Cardiology  08/04/19   Burnell Blanks, MD Consulting Physician Cardiology  08/04/19     Objective  Vitals: BP 110/62 (BP Location: Left Arm, Patient Position: Sitting, Cuff Size: Large)   Pulse 67   Temp 97.6 F (36.4 C) (Temporal)   Ht _0  (1.727 m)   Wt 228 lb (103.4 kg)   LMP 07/29/2019   SpO2 99%   BMI 34.67 kg/m  General:  Well developed, well nourished, no acute distress  Psych:  Alert and orientedx3,normal mood and affect, appears happy HEENT:  Normocephalic, atraumatic, non-icteric sclera, PERRL,supple neck without adenopathy, mass or thyromegaly Cardiovascular:  Normal S1, S2, RRR without gallop, rub or murmur, nondisplaced PMI Respiratory:  Good breath sounds bilaterally, CTAB with normal respiratory effort Gastrointestinal: normal bowel sounds, soft, non-tender, no noted masses. No HSM MSK: no deformities, contusions. Joints are without erythema or swelling. Spine and CVA region are nontender Skin:  Warm, no rashes or suspicious lesions noted Left great toenail with no growth and 2 to 3 mm annular ecchymotic lesion with irregular borders and a bright red edge Neurologic:    Mental status is normal. CN 2-11 are normal. Gross motor and sensory exams are normal. Normal gait. No tremor Breast Exam: No mass, skin retraction or nipple discharge is appreciated in either breast. No axillary adenopathy. Fibrocystic changes are not noted   Commons side effects, risks, benefits, and alternatives for medications and treatment plan prescribed today were discussed, and the patient expressed understanding of the given instructions. Patient is instructed to  call or message via MyChart if he/she has any questions or concerns regarding our treatment plan. No barriers to understanding were identified. We discussed Red Flag symptoms and signs in detail. Patient expressed understanding regarding what to do in case of urgent or emergency type symptoms.   Medication list was reconciled, printed and provided to the patient in AVS. Patient instructions and summary information was reviewed with the patient as documented in the AVS. This note was prepared with assistance of Dragon voice recognition software. Occasional wrong-word or sound-a-like substitutions may have occurred due to the inherent limitations of voice recognition software  This visit occurred during the SARS-CoV-2 public health emergency.  Safety protocols were in place, including screening questions prior to the visit, additional usage of staff PPE, and extensive cleaning of exam room while observing appropriate contact time as indicated for disinfecting solutions.

## 2019-08-04 NOTE — Patient Instructions (Signed)
Please return in 12 months for your annual complete physical; please come fasting.  I will release your lab results to you on your MyChart account with further instructions. Please reply with any questions.    It was a pleasure meeting you today! Thank you for choosing [place] to meet your healthcare needs! I truly look forward to working with you. If you have any questions or concerns, please send me a message via Mychart or call the office at [phone].   Insomnia Insomnia is a sleep disorder that makes it difficult to fall asleep or stay asleep. Insomnia can cause fatigue, low energy, difficulty concentrating, mood swings, and poor performance at work or school. There are three different ways to classify insomnia:  Difficulty falling asleep.  Difficulty staying asleep.  Waking up too early in the morning. Any type of insomnia can be long-term (chronic) or short-term (acute). Both are common. Short-term insomnia usually lasts for three months or less. Chronic insomnia occurs at least three times a week for longer than three months. What are the causes? Insomnia may be caused by another condition, situation, or substance, such as:  Anxiety.  Certain medicines.  Gastroesophageal reflux disease (GERD) or other gastrointestinal conditions.  Asthma or other breathing conditions.  Restless legs syndrome, sleep apnea, or other sleep disorders.  Chronic pain.  Menopause.  Stroke.  Abuse of alcohol, tobacco, or illegal drugs.  Mental health conditions, such as depression.  Caffeine.  Neurological disorders, such as Alzheimer's disease.  An overactive thyroid (hyperthyroidism). Sometimes, the cause of insomnia may not be known. What increases the risk? Risk factors for insomnia include:  Gender. Women are affected more often than men.  Age. Insomnia is more common as you get older.  Stress.  Lack of exercise.  Irregular work schedule or working night  shifts.  Traveling between different time zones.  Certain medical and mental health conditions. What are the signs or symptoms? If you have insomnia, the main symptom is having trouble falling asleep or having trouble staying asleep. This may lead to other symptoms, such as:  Feeling fatigued or having low energy.  Feeling nervous about going to sleep.  Not feeling rested in the morning.  Having trouble concentrating.  Feeling irritable, anxious, or depressed. How is this diagnosed? This condition may be diagnosed based on:  Your symptoms and medical history. Your health care provider may ask about: ? Your sleep habits. ? Any medical conditions you have. ? Your mental health.  A physical exam. How is this treated? Treatment for insomnia depends on the cause. Treatment may focus on treating an underlying condition that is causing insomnia. Treatment may also include:  Medicines to help you sleep.  Counseling or therapy.  Lifestyle adjustments to help you sleep better. Follow these instructions at home: Eating and drinking   Limit or avoid alcohol, caffeinated beverages, and cigarettes, especially close to bedtime. These can disrupt your sleep.  Do not eat a large meal or eat spicy foods right before bedtime. This can lead to digestive discomfort that can make it hard for you to sleep. Sleep habits   Keep a sleep diary to help you and your health care provider figure out what could be causing your insomnia. Write down: ? When you sleep. ? When you wake up during the night. ? How well you sleep. ? How rested you feel the next day. ? Any side effects of medicines you are taking. ? What you eat and drink.  Make your bedroom  a dark, comfortable place where it is easy to fall asleep. ? Put up shades or blackout curtains to block light from outside. ? Use a white noise machine to block noise. ? Keep the temperature cool.  Limit screen use before bedtime. This  includes: ? Watching TV. ? Using your smartphone, tablet, or computer.  Stick to a routine that includes going to bed and waking up at the same times every day and night. This can help you fall asleep faster. Consider making a quiet activity, such as reading, part of your nighttime routine.  Try to avoid taking naps during the day so that you sleep better at night.  Get out of bed if you are still awake after 15 minutes of trying to sleep. Keep the lights down, but try reading or doing a quiet activity. When you feel sleepy, go back to bed. General instructions  Take over-the-counter and prescription medicines only as told by your health care provider.  Exercise regularly, as told by your health care provider. Avoid exercise starting several hours before bedtime.  Use relaxation techniques to manage stress. Ask your health care provider to suggest some techniques that may work well for you. These may include: ? Breathing exercises. ? Routines to release muscle tension. ? Visualizing peaceful scenes.  Make sure that you drive carefully. Avoid driving if you feel very sleepy.  Keep all follow-up visits as told by your health care provider. This is important. Contact a health care provider if:  You are tired throughout the day.  You have trouble in your daily routine due to sleepiness.  You continue to have sleep problems, or your sleep problems get worse. Get help right away if:  You have serious thoughts about hurting yourself or someone else. If you ever feel like you may hurt yourself or others, or have thoughts about taking your own life, get help right away. You can go to your nearest emergency department or call:  Your local emergency services (911 in the [place]  A suicide crisis helpline, such as the [facility] at (267)012-0254. This is open 24 hours a day. Summary  Insomnia is a sleep disorder that makes it difficult to fall asleep or stay  asleep.  Insomnia can be long-term (chronic) or short-term (acute).  Treatment for insomnia depends on the cause. Treatment may focus on treating an underlying condition that is causing insomnia.  Keep a sleep diary to help you and your health care provider figure out what could be causing your insomnia. This information is not intended to replace advice given to you by your health care provider. Make sure you discuss any questions you have with your health care provider. Document Revised: 05/31/2017 Document Reviewed: 03/28/2017 Elsevier Patient Education  2020 [employer].

## 2019-08-05 ENCOUNTER — Other Ambulatory Visit: Payer: Self-pay | Admitting: Family Medicine

## 2019-08-05 ENCOUNTER — Encounter: Payer: Self-pay | Admitting: Family Medicine

## 2019-08-05 DIAGNOSIS — N6320 Unspecified lump in the left breast, unspecified quadrant: Secondary | ICD-10-CM

## 2019-08-05 LAB — CBC WITH DIFFERENTIAL/PLATELET
Basophils Absolute: 0.1 10*3/uL (ref 0.0–0.1)
Basophils Relative: 0.8 % (ref 0.0–3.0)
Eosinophils Absolute: 0.1 10*3/uL (ref 0.0–0.7)
Eosinophils Relative: 1.6 % (ref 0.0–5.0)
HCT: 42.3 % (ref 36.0–46.0)
Hemoglobin: 14.3 g/dL (ref 12.0–15.0)
Lymphocytes Relative: 29.3 % (ref 12.0–46.0)
Lymphs Abs: 2.5 10*3/uL (ref 0.7–4.0)
MCHC: 33.8 g/dL (ref 30.0–36.0)
MCV: 84.1 fl (ref 78.0–100.0)
Monocytes Absolute: 0.6 10*3/uL (ref 0.1–1.0)
Monocytes Relative: 6.4 % (ref 3.0–12.0)
Neutro Abs: 5.4 10*3/uL (ref 1.4–7.7)
Neutrophils Relative %: 61.9 % (ref 43.0–77.0)
Platelets: 290 10*3/uL (ref 150.0–400.0)
RBC: 5.03 Mil/uL (ref 3.87–5.11)
RDW: 12.8 % (ref 11.5–15.5)
WBC: 8.7 10*3/uL (ref 4.0–10.5)

## 2019-08-05 LAB — COMPREHENSIVE METABOLIC PANEL
ALT: 12 U/L (ref 0–35)
AST: 15 U/L (ref 0–37)
Albumin: 4.2 g/dL (ref 3.5–5.2)
Alkaline Phosphatase: 66 U/L (ref 39–117)
BUN: 13 mg/dL (ref 6–23)
CO2: 30 mEq/L (ref 19–32)
Calcium: 9.7 mg/dL (ref 8.4–10.5)
Chloride: 100 mEq/L (ref 96–112)
Creatinine, Ser: 0.77 mg/dL (ref 0.40–1.20)
GFR: 80.47 mL/min (ref >60.00)
Glucose, Bld: 98 mg/dL (ref 70–99)
Potassium: 4.5 mEq/L (ref 3.5–5.1)
Sodium: 137 mEq/L (ref 135–145)
Total Bilirubin: 0.5 mg/dL (ref 0.2–1.2)
Total Protein: 7.2 g/dL (ref 6.0–8.3)

## 2019-08-05 LAB — LIPID PANEL
Cholesterol: 279 mg/dL — ABNORMAL HIGH (ref 0–200)
HDL: 55.4 mg/dL (ref >39.00)
LDL Cholesterol: 197 mg/dL — ABNORMAL HIGH (ref 0–99)
NonHDL: 223.28
Total CHOL/HDL Ratio: 5
Triglycerides: 132 mg/dL (ref 0.0–149.0)
VLDL: 26.4 mg/dL (ref 0.0–40.0)

## 2019-08-05 LAB — TSH: TSH: 0.66 u[IU]/mL (ref 0.35–4.50)

## 2019-08-07 ENCOUNTER — Ambulatory Visit
Admission: RE | Admit: 2019-08-07 | Discharge: 2019-08-07 | Disposition: A | Discharge status: Home / Self Care | Payer: BC Managed Care – PPO | Source: Ambulatory Visit | Attending: Family Medicine | Admitting: Family Medicine

## 2019-08-07 ENCOUNTER — Other Ambulatory Visit: Payer: Self-pay | Admitting: Family Medicine

## 2019-08-07 ENCOUNTER — Other Ambulatory Visit: Payer: Self-pay

## 2019-08-07 DIAGNOSIS — N6320 Unspecified lump in the left breast, unspecified quadrant: Secondary | ICD-10-CM

## 2019-08-07 DIAGNOSIS — N6321 Unspecified lump in the left breast, upper outer quadrant: Secondary | ICD-10-CM | POA: Diagnosis not present

## 2019-08-11 ENCOUNTER — Ambulatory Visit
Admission: RE | Admit: 2019-08-11 | Discharge: 2019-08-11 | Disposition: A | Discharge status: Home / Self Care | Payer: BC Managed Care – PPO | Source: Ambulatory Visit | Attending: Family Medicine | Admitting: Family Medicine

## 2019-08-11 ENCOUNTER — Other Ambulatory Visit: Payer: Self-pay

## 2019-08-11 DIAGNOSIS — N6320 Unspecified lump in the left breast, unspecified quadrant: Secondary | ICD-10-CM

## 2019-08-11 DIAGNOSIS — N6321 Unspecified lump in the left breast, upper outer quadrant: Secondary | ICD-10-CM | POA: Diagnosis not present

## 2019-08-11 DIAGNOSIS — D242 Benign neoplasm of left breast: Secondary | ICD-10-CM | POA: Diagnosis not present

## 2019-08-11 HISTORY — PX: BREAST BIOPSY: SHX20

## 2019-08-12 ENCOUNTER — Encounter: Payer: Self-pay | Admitting: Family Medicine

## 2019-08-12 ENCOUNTER — Other Ambulatory Visit: Payer: BC Managed Care – PPO

## 2019-08-12 DIAGNOSIS — D242 Benign neoplasm of left breast: Secondary | ICD-10-CM

## 2019-08-12 HISTORY — DX: Benign neoplasm of left breast: D24.2

## 2019-09-02 DIAGNOSIS — Z713 Dietary counseling and surveillance: Secondary | ICD-10-CM | POA: Diagnosis not present

## 2019-09-03 ENCOUNTER — Other Ambulatory Visit: Payer: Self-pay

## 2019-09-03 ENCOUNTER — Encounter: Payer: Self-pay | Admitting: Cardiology

## 2019-09-03 ENCOUNTER — Ambulatory Visit: Payer: BC Managed Care – PPO | Admitting: Cardiology

## 2019-09-03 VITALS — BP 106/78 | HR 71 | Ht 68.0 in | Wt 223.0 lb

## 2019-09-03 DIAGNOSIS — I471 Supraventricular tachycardia: Secondary | ICD-10-CM

## 2019-09-03 MED ORDER — DILTIAZEM HCL ER COATED BEADS 180 MG PO CP24: 180.0000 mg | ORAL_CAPSULE | Freq: Every day | ORAL | 3 refills | Status: DC

## 2019-09-03 NOTE — Patient Instructions (Addendum)
Medication Instructions:  Your physician has recommended you make the following change in your medication:  1. START Diltiazem 180 mg once daily  *If you need a refill on your cardiac medications before your next appointment, please call your pharmacy*   Lab Work: None ordered If you have labs (blood work) drawn today and your tests are completely normal, you will receive your results only by: Marland Kitchen MyChart Message (if you have MyChart) OR . A paper copy in the mail If you have any lab test that is abnormal or we need to change your treatment, we will call you to review the results.   Testing/Procedures: None ordered   Follow-Up: At [facility], you and your health needs are our priority.  As part of our continuing mission to provide you with exceptional heart care, we have created designated Provider Care Teams.  These Care Teams include your primary Cardiologist (physician) and Advanced Practice Providers (APPs -  Physician Assistants and Nurse Practitioners) who all work together to provide you with the care you need, when you need it.  We recommend signing up for the patient portal called "MyChart".  Sign up information is provided on this After Visit Summary.  MyChart is used to connect with patients for Virtual Visits (Telemedicine).  Patients are able to view lab/test results, encounter notes, upcoming appointments, etc.  Non-urgent messages can be sent to your provider as well.   To learn more about what you can do with MyChart, go to [email or web address].    Your next appointment:   3 month(s)  The format for your next appointment:   In Person  Provider:   Allegra Lai, MD   Thank you for choosing [facility]!!   Trinidad Curet, RN [phone]    Other Instructions  Diltiazem Oral Tablets What is this medicine? DILTIAZEM (dil TYE a zem) is a calcium channel blocker. It relaxes your blood vessels and decreases the amount of work the heart has to do. It  treats and/or prevents chest pain (also called angina). This medicine may be used for other purposes; ask your health care provider or pharmacist if you have questions. COMMON BRAND NAME(S): Cardizem What should I tell my health care provider before I take this medicine? They need to know if you have any of these conditions:  heart attack  heart disease  irregular heartbeat or rhythm  low blood pressure  an unusual or allergic reaction to diltiazem, other drugs, foods, dyes, or preservatives  pregnant or trying to get pregnant  breast-feeding How should I use this medicine? Take this drug by mouth. Take it as directed on the prescription label at the same time every day. Keep taking it unless your health care provider tells you to stop. Talk to your health care provider about the use of this drug in children. Special care may be needed. Overdosage: If you think you have taken too much of this medicine contact a poison control center or emergency room at once. NOTE: This medicine is only for you. Do not share this medicine with others. What if I miss a dose? If you miss a dose, take it as soon as you can. If it is almost time for your next dose, take only that dose. Do not take double or extra doses. What may interact with this medicine? Do not take this medicine with any of the following:  cisapride  hawthorn  pimozide  ranolazine  red yeast rice This medicine may also interact with the following  medications:  buspirone  carbamazepine  cimetidine  cyclosporine  digoxin  local anesthetics or general anesthetics  lovastatin  medicines for anxiety or difficulty sleeping like midazolam and triazolam  medicines for high blood pressure or heart problems  quinidine  rifampin, rifabutin, or rifapentine This list may not describe all possible interactions. Give your health care provider a list of all the medicines, herbs, non-prescription drugs, or dietary  supplements you use. Also tell them if you smoke, drink alcohol, or use illegal drugs. Some items may interact with your medicine. What should I watch for while using this medicine? Visit your health care provider for regular checks on your progress. Check your blood pressure as directed. Ask your health care provider what your blood pressure should be. Also, find out when you should contact him or her. Do not treat yourself for coughs, colds, or pain while you are using this drug without asking your health care provider for advice. Some drugs may increase your blood pressure. This drug may cause serious skin reactions. They can happen weeks to months after starting the drug. Contact your health care provider right away if you notice fevers or flu-like symptoms with a rash. The rash may be red or purple and then turn into blisters or peeling of the skin. Or, you might notice a red rash with swelling of the face, lips or lymph nodes in your neck or under your arms. You may get drowsy or dizzy. Do not drive, use machinery, or do anything that needs mental alertness until you know how this drug affects you. Do not stand up or sit up quickly, especially if you are an older patient. This reduces the risk of dizzy or fainting spells. What side effects may I notice from receiving this medicine? Side effects that you should report to your doctor or health care provider as soon as possible:  allergic reactions (skin rash, itching or hives; swelling of the face, lips, or tongue)  heart failure (trouble breathing; fast, irregular heartbeat; sudden weight gain; swelling of the ankles, feet, hands; unusually weak or tired)  heartbeat rhythm changes (trouble breathing; chest pain; dizziness; fast, irregular heartbeat; feeling faint or lightheaded, falls)  liver injury (dark yellow or brown urine; general ill feeling or flu-like symptoms; loss of appetite, right upper belly pain; unusually weak or tired, yellowing  of the eyes or skin)  low blood pressure (dizziness; feeling faint or lightheaded, falls; unusually weak or tired)  redness, blistering, peeling, or loosening of the skin, including inside the mouth Side effects that usually do not require medical attention (report to your doctor or health care provider if they continue or are bothersome):  changes in sex drive or performance  depressed mood  headache  sudden weight gain  nausea  sudden weight gain  trouble sleeping This list may not describe all possible side effects. Call your doctor for medical advice about side effects. You may report side effects to FDA at 1-800-FDA-1088. Where should I keep my medicine? Keep out of the reach of children and pets. Store at room temperature between 15 and 30 degrees C (59 and 86 degrees F). Protect from moisture. Keep the container tightly closed. Throw away any unused drug after the expiration date. NOTE: This sheet is a summary. It may not cover all possible information. If you have questions about this medicine, talk to your doctor, pharmacist, or health care provider.  2020 Elsevier/Gold Standard (2019-03-24 18:13:24)     Cardiac Ablation Cardiac ablation is a  procedure to disable (ablate) a small amount of heart tissue in very specific places. The heart has many electrical connections. Sometimes these connections are abnormal and can cause the heart to beat very fast or irregularly. Ablating some of the problem areas can improve the heart rhythm or return it to normal. Ablation may be done for people who:  Have Wolff-Parkinson-White syndrome.  Have fast heart rhythms (tachycardia).  Have taken medicines for an abnormal heart rhythm (arrhythmia) that were not effective or caused side effects.  Have a high-risk heartbeat that may be life-threatening. During the procedure, a small incision is made in the neck or the groin, and a long, thin, flexible tube (catheter) is inserted into the  incision and moved to the heart. Small devices (electrodes) on the tip of the catheter will send out electrical currents. A type of X-ray (fluoroscopy) will be used to help guide the catheter and to provide images of the heart. Tell a health care provider about:  Any allergies you have.  All medicines you are taking, including vitamins, herbs, eye drops, creams, and over-the-counter medicines.  Any problems you or family members have had with anesthetic medicines.  Any blood disorders you have.  Any surgeries you have had.  Any medical conditions you have, such as kidney failure.  Whether you are pregnant or may be pregnant. What are the risks? Generally, this is a safe procedure. However, problems may occur, including:  Infection.  Bruising and bleeding at the catheter insertion site.  Bleeding into the chest, especially into the sac that surrounds the heart. This is a serious complication.  Stroke or blood clots.  Damage to other structures or organs.  Allergic reaction to medicines or dyes.  Need for a permanent pacemaker if the normal electrical system is damaged. A pacemaker is a small computer that sends electrical signals to the heart and helps your heart beat normally.  The procedure not being fully effective. This may not be recognized until months later. Repeat ablation procedures are sometimes required. What happens before the procedure?  Follow instructions from your health care provider about eating or drinking restrictions.  Ask your health care provider about: ? Changing or stopping your regular medicines. This is especially important if you are taking diabetes medicines or blood thinners. ? Taking medicines such as aspirin and ibuprofen. These medicines can thin your blood. Do not take these medicines before your procedure if your health care provider instructs you not to.  Plan to have someone take you home from the hospital or clinic.  If you will be  going home right after the procedure, plan to have someone with you for 24 hours. What happens during the procedure?  To lower your risk of infection: ? Your health care team will wash or sanitize their hands. ? Your skin will be washed with soap. ? Hair may be removed from the incision area.  An IV tube will be inserted into one of your veins.  You will be given a medicine to help you relax (sedative).  The skin on your neck or groin will be numbed.  An incision will be made in your neck or your groin.  A needle will be inserted through the incision and into a large vein in your neck or groin.  A catheter will be inserted into the needle and moved to your heart.  Dye may be injected through the catheter to help your surgeon see the area of the heart that needs treatment.  Dealer  currents will be sent from the catheter to ablate heart tissue in desired areas. There are three types of energy that may be used to ablate heart tissue: ? Heat (radiofrequency energy). ? Laser energy. ? Extreme cold (cryoablation).  When the necessary tissue has been ablated, the catheter will be removed.  Pressure will be held on the catheter insertion area to prevent excessive bleeding.  A bandage (dressing) will be placed over the catheter insertion area. The procedure may vary among health care providers and hospitals. What happens after the procedure?  Your blood pressure, heart rate, breathing rate, and blood oxygen level will be monitored until the medicines you were given have worn off.  Your catheter insertion area will be monitored for bleeding. You will need to lie still for a few hours to ensure that you do not bleed from the catheter insertion area.  Do not drive for 24 hours or as long as directed by your health care provider. Summary  Cardiac ablation is a procedure to disable (ablate) a small amount of heart tissue in very specific places. Ablating some of the problem areas can  improve the heart rhythm or return it to normal.  During the procedure, electrical currents will be sent from the catheter to ablate heart tissue in desired areas. This information is not intended to replace advice given to you by your health care provider. Make sure you discuss any questions you have with your health care provider. Document Revised: 12/09/2017 Document Reviewed: 05/07/2016 Elsevier Patient Education  [place].

## 2019-09-03 NOTE — Progress Notes (Signed)
Electrophysiology Office Note   Date:  09/03/2019   ID:  [name], DOB 12-02-1970, MRN [id number]  PCP:  Pamela Arnt, MD  Cardiologist:  [name] Primary Electrophysiologist:  Pamela Gal Meredith Leeds, MD    No chief complaint on file.    History of Present Illness: [name] is a 49 y.o. female who is being seen today for the evaluation of [name] at the request of [name]. She is a Pharmacist, hospital at [employer]. Presenting today for electrophysiology evaluation.  She has a history of pericarditis status post pericardial window.  She was seen by general cardiology with [name].  She has been followed for recurrent idiopathic pericarditis in the past in [place].  She was diagnosed in 2008.  At that time, she had a pericardial window at [facility].  She had been treated with methotrexate in the past.  She also has [name].  She was living in [place] 2 years ago when she noted [name].    She has been on as needed diltiazem.  Over the last few months, she has had more frequent episodes of [name].  She did have Covid October 2020 and her symptoms worsened after that.  Her palpitations last from 3 to 10 minutes.  Her longest episode lasted 30 minutes.  She has occasional dizziness with her elevated heart rates.  She has no chest pain or associated shortness of breath.  She has not had syncope.  She continues to exercise without issue.  She has not yet taken her as needed diltiazem.  Today, denies symptoms of , chest pain, shortness of breath, orthopnea, PND, lower extremity edema, claudication, dizziness, presyncope, syncope, bleeding, or neurologic sequela. The patient is tolerating medications without difficulties.     Past Medical History:  Diagnosis Date  . Breast fibroadenoma, left 08/12/2019   biospy 08/2019  . Chronic idiopathic pericarditis 05/13/2017  . HLD (hyperlipidemia) 09/16/2017  . Obesity (BMI 30-39.9) 05/13/2017  . Onychomycosis 07/29/2018  . PSVT (paroxysmal supraventricular  tachycardia) ([place]) 05/13/2017   Past Surgical History:  Procedure Laterality Date  . CESAREAN SECTION    . PERICARDIAL WINDOW       Current Outpatient Medications  Medication Sig Dispense Refill  . diltiazem (CARDIZEM) 30 MG tablet Take one tablet by mouth every 6 hours as needed for palpitations. 30 tablet 6  . norgestimate-ethinyl estradiol (PREVIFEM) 0.25-35 MG-MCG tablet Take 1 tablet by mouth daily. 1 Package 11  . predniSONE (DELTASONE) 10 MG tablet Take 10 mg by mouth as needed (idiopathic pericarditis). Take as needed.     . diltiazem (CARDIZEM CD) 180 MG 24 hr capsule Take 1 capsule (180 mg total) by mouth daily. 30 capsule 3   No current facility-administered medications for this visit.    Allergies:   Patient has no known allergies.   Social History:  The patient  reports that she has never smoked. She has never used smokeless tobacco. She reports current alcohol use. She reports that she does not use drugs.   Family History:  The patient's family history includes Arthritis in her mother; Cancer in her maternal grandfather and paternal grandmother; Diabetes in her father; Early death in her father; High Cholesterol in her father.    ROS:  Please see the history of present illness.   Otherwise, review of systems is positive for none.   All other systems are reviewed and negative.   PHYSICAL EXAM: VS:  BP 106/78   Pulse 71   Ht 5\' 8"  (1.727 m)  Wt 223 lb (101.2 kg)   SpO2 99%   BMI 33.91 kg/m  , BMI Body mass index is 33.91 kg/m. GEN: Well nourished, well developed, in no acute distress  HEENT: normal  Neck: no JVD, carotid bruits, or masses Cardiac: RRR; no murmurs, rubs, or gallops,no edema  Respiratory:  clear to auscultation bilaterally, normal work of breathing GI: soft, nontender, nondistended, + BS MS: no deformity or atrophy  Skin: warm and dry Neuro:  Strength and sensation are intact Psych: euthymic mood, full affect  EKG:  EKG is ordered today.  Personal review of the ekg ordered shows sinus rhythm, rate 71  Recent Labs: 08/04/2019: ALT 12; BUN 13; Creatinine, Ser 0.77; Hemoglobin 14.3; Platelets 290.0; Potassium 4.5; Sodium 137; TSH 0.66    Lipid Panel     Component Value Date/Time   CHOL 279 (H) 08/04/2019 1532   TRIG 132.0 08/04/2019 1532   HDL 55.40 08/04/2019 1532   CHOLHDL 5 08/04/2019 1532   VLDL 26.4 08/04/2019 1532   LDLCALC 197 (H) 08/04/2019 1532     Wt Readings from Last 3 Encounters:  09/03/19 223 lb (101.2 kg)  08/04/19 228 lb (103.4 kg)  07/07/19 234 lb (106.1 kg)      Other studies Reviewed: Additional studies/ records that were reviewed today include: TTE 11/29/17  Review of the above records today demonstrates:  - Left ventricle: The cavity size was normal. Systolic function was   normal. The estimated ejection fraction was in the range of 55%   to 60%. Wall motion was normal; there were no regional wall   motion abnormalities. Left ventricular diastolic function   parameters were normal. - Atrial septum: No defect or patent foramen ovale was identified.  30 day monitor 11/29/17 - personally reviewed Sinus rhythm Premature ventricular contractions Several episodes of supraventricular tachycardia ([name])  ASSESSMENT AND PLAN:  1.  [name]: Likely due to AVNRT.  She is currently on as needed diltiazem.  As her palpitations have increased, she would like further therapy.  She is concerned about coronavirus issues currently and would like to wait until after the pandemic to have possibly ablation.  Due to that, we [name] start her on 180 mg of diltiazem daily.  I told her that is also okay to take the as needed diltiazem if she goes into [name].  We [name] see her back to discuss ablation.  2.  Chronic pericarditis: No recent chest pain.  Plan per primary cardiology.   Current medicines are reviewed at length with the patient today.   The patient does not have concerns regarding her medicines.  The following  changes were made today: Start diltiazem  Labs/ tests ordered today include:  Orders Placed This Encounter  Procedures  . EKG 12-Lead    Disposition:   FU with [name] 6 months  Signed, Sargon Scouten Meredith Leeds, MD  09/03/2019 9:16 AM     [facility] [address] [place] [address] [phone] (office) [phone] (fax)

## 2019-09-30 DIAGNOSIS — Z713 Dietary counseling and surveillance: Secondary | ICD-10-CM | POA: Diagnosis not present

## 2019-10-12 ENCOUNTER — Emergency Department (HOSPITAL_COMMUNITY)
Admission: EM | Admit: 2019-10-12 | Discharge: 2019-10-12 | Discharge status: Absent without leave | Payer: BC Managed Care – PPO

## 2019-10-28 DIAGNOSIS — Z713 Dietary counseling and surveillance: Secondary | ICD-10-CM | POA: Diagnosis not present

## 2019-12-08 ENCOUNTER — Encounter: Payer: Self-pay | Admitting: Cardiology

## 2019-12-08 ENCOUNTER — Ambulatory Visit: Payer: BC Managed Care – PPO | Admitting: Cardiology

## 2019-12-08 ENCOUNTER — Other Ambulatory Visit: Payer: Self-pay

## 2019-12-08 VITALS — BP 112/78 | HR 69 | Ht 68.0 in | Wt 216.2 lb

## 2019-12-08 DIAGNOSIS — I471 Supraventricular tachycardia: Secondary | ICD-10-CM | POA: Diagnosis not present

## 2019-12-08 MED ORDER — DILTIAZEM HCL ER COATED BEADS 240 MG PO CP24: 240.0000 mg | ORAL_CAPSULE | Freq: Every day | ORAL | 3 refills | Status: DC

## 2019-12-08 NOTE — Progress Notes (Signed)
Electrophysiology Office Note   Date:  12/08/2019   ID:  [name], DOB [birth year], MRN [id number]  PCP:  Leamon Arnt, MD  Cardiologist:  Angelena Form Primary Electrophysiologist:  Mattthew Ziomek Pamela Leeds, MD    No chief complaint on file.    History of Present Illness: [name] is a 49 y.o. female who is being seen today for the evaluation of [name] at the request of Darlina Guys. She is a Pharmacist, hospital at [employer]. Presenting today for electrophysiology evaluation.  She has a history of pericarditis status post pericardial window.  She was seen by general cardiology with [name].  She has been followed for recurrent idiopathic pericarditis in the past in [place].  She was diagnosed in 2008.  At that time, she had a pericardial window at [facility].  She had been treated with methotrexate in the past.  She also has [name].  She was living in [place] 2 years ago when she noted [name].    She has been on as needed diltiazem.  Over the last few months, she has had more frequent episodes of [name].  She did have Covid October 2020 and her symptoms worsened after that.  Her palpitations last from 3 to 10 minutes.  Her longest episode lasted 30 minutes.  She has occasional dizziness with her elevated heart rates.  She has no chest pain or associated shortness of breath.  She has not had syncope.  She continues to exercise without issue.  She has not yet taken her as needed diltiazem.  Today, denies symptoms of palpitations, chest pain, shortness of breath, orthopnea, PND, lower extremity edema, claudication, dizziness, presyncope, syncope, bleeding, or neurologic sequela. The patient is tolerating medications without difficulties. Overall she has been feeling better since her diltiazem dose was started. She is continuing to have episodic palpitations for up to a minute at a time a few times a week. Aside from that, she is able to do all of her daily activities without restriction.     Past Medical History:   Diagnosis Date   Breast fibroadenoma, left 08/12/2019   biospy 08/2019   Chronic idiopathic pericarditis 05/13/2017   HLD (hyperlipidemia) 09/16/2017   Obesity (BMI 30-39.9) 05/13/2017   Onychomycosis 07/29/2018   PSVT (paroxysmal supraventricular tachycardia) ([place]) 05/13/2017   Past Surgical History:  Procedure Laterality Date   CESAREAN SECTION     PERICARDIAL WINDOW       Current Outpatient Medications  Medication Sig Dispense Refill   diltiazem (CARDIZEM) 30 MG tablet Take one tablet by mouth every 6 hours as needed for palpitations. 30 tablet 6   norgestimate-ethinyl estradiol (PREVIFEM) 0.25-35 MG-MCG tablet Take 1 tablet by mouth daily. 1 Package 11   predniSONE (DELTASONE) 10 MG tablet Take 10 mg by mouth as needed (idiopathic pericarditis). Take as needed.      diltiazem (CARDIZEM CD) 240 MG 24 hr capsule Take 1 capsule (240 mg total) by mouth daily. 60 capsule 3   No current facility-administered medications for this visit.    Allergies:   Patient has no known allergies.   Social History:  The patient  reports that she has never smoked. She has never used smokeless tobacco. She reports current alcohol use. She reports that she does not use drugs.   Family History:  The patient's family history includes Arthritis in her mother; Cancer in her maternal grandfather and paternal grandmother; Diabetes in her father; Early death in her father; High Cholesterol in her father.    ROS:  Please see the history of present illness.   Otherwise, review of systems is positive for none.   All other systems are reviewed and negative.   PHYSICAL EXAM: VS:  BP 112/78    Pulse 69    Ht 5\' 8"  (1.727 m)    Wt 216 lb 3.2 oz (98.1 kg)    SpO2 96%    BMI 32.87 kg/m  , BMI Body mass index is 32.87 kg/m. GEN: Well nourished, well developed, in no acute distress  HEENT: normal  Neck: no JVD, carotid bruits, or masses Cardiac: RRR; no murmurs, rubs, or gallops,no edema  Respiratory:   clear to auscultation bilaterally, normal work of breathing GI: soft, nontender, nondistended, + BS MS: no deformity or atrophy  Skin: warm and dry Neuro:  Strength and sensation are intact Psych: euthymic mood, full affect  EKG:  EKG is ordered today. Personal review of the ekg ordered shows sinus rhythm, rate 64   Recent Labs: 08/04/2019: ALT 12; BUN 13; Creatinine, Ser 0.77; Hemoglobin 14.3; Platelets 290.0; Potassium 4.5; Sodium 137; TSH 0.66    Lipid Panel     Component Value Date/Time   CHOL 279 (H) 08/04/2019 1532   TRIG 132.0 08/04/2019 1532   HDL 55.40 08/04/2019 1532   CHOLHDL 5 08/04/2019 1532   VLDL 26.4 08/04/2019 1532   LDLCALC 197 (H) 08/04/2019 1532     Wt Readings from Last 3 Encounters:  12/08/19 216 lb 3.2 oz (98.1 kg)  09/03/19 223 lb (101.2 kg)  08/04/19 228 lb (103.4 kg)      Other studies Reviewed: Additional studies/ records that were reviewed today include: TTE 11/29/17  Review of the above records today demonstrates:  - Left ventricle: The cavity size was normal. Systolic function was   normal. The estimated ejection fraction was in the range of 55%   to 60%. Wall motion was normal; there were no regional wall   motion abnormalities. Left ventricular diastolic function   parameters were normal. - Atrial septum: No defect or patent foramen ovale was identified.  30 day monitor 11/29/17 - personally reviewed Sinus rhythm Premature ventricular contractions Several episodes of supraventricular tachycardia ([name])  ASSESSMENT AND PLAN:  1.  [name]: Likely due to AVNRT.  Is currently on diltiazem and has as needed diltiazem as well. She currently has minimal palpitations, but is having them for up to 30 seconds. This is greatly improved from her prior symptoms. Aerica Rincon increase diltiazem to 240 mg.  2.  Chronic pericarditis: No recurrent chest pain.  Plan per primary cardiology.   Current medicines are reviewed at length with the patient today.   The  patient does not have concerns regarding her medicines.  The following changes were made today: Increase diltiazem  Labs/ tests ordered today include:  Orders Placed This Encounter  Procedures   EKG 12-Lead    Disposition:   FU with Victora Irby 3 months  Signed, Jaymon Dudek Pamela Leeds, MD  12/08/2019 10:06 AM     [address] [place] [address] [phone] (office) [phone] (fax)

## 2019-12-08 NOTE — Patient Instructions (Addendum)
Medication Instructions:  Your physician has recommended you make the following change in your medication:  1. INCREASE Diltiazem to 240 mg daily  *If you need a refill on your cardiac medications before your next appointment, please call your pharmacy*   Lab Work: None ordered If you have labs (blood work) drawn today and your tests are completely normal, you will receive your results only by: Marland Kitchen MyChart Message (if you have MyChart) OR . A paper copy in the mail If you have any lab test that is abnormal or we need to change your treatment, we will call you to review the results.   Testing/Procedures: None ordered   Follow-Up: At [facility], you and your health needs are our priority.  As part of our continuing mission to provide you with exceptional heart care, we have created designated Provider Care Teams.  These Care Teams include your primary Cardiologist (physician) and Advanced Practice Providers (APPs -  Physician Assistants and Nurse Practitioners) who all work together to provide you with the care you need, when you need it.  We recommend signing up for the patient portal called "MyChart".  Sign up information is provided on this After Visit Summary.  MyChart is used to connect with patients for Virtual Visits (Telemedicine).  Patients are able to view lab/test results, encounter notes, upcoming appointments, etc.  Non-urgent messages can be sent to your provider as well.   To learn more about what you can do with MyChart, go to [email or web address].    Your next appointment:   3 month(s)  The format for your next appointment:   In Person  Provider:   Allegra Lai, MD   Thank you for choosing [facility]!!   Trinidad Curet, RN [phone]    Other Instructions

## 2019-12-25 DIAGNOSIS — Z713 Dietary counseling and surveillance: Secondary | ICD-10-CM | POA: Diagnosis not present

## 2020-01-16 DIAGNOSIS — J029 Acute pharyngitis, unspecified: Secondary | ICD-10-CM | POA: Diagnosis not present

## 2020-01-16 DIAGNOSIS — R0982 Postnasal drip: Secondary | ICD-10-CM | POA: Diagnosis not present

## 2020-01-18 ENCOUNTER — Other Ambulatory Visit: Payer: BC Managed Care – PPO

## 2020-01-18 DIAGNOSIS — Z03818 Encounter for observation for suspected exposure to other biological agents ruled out: Secondary | ICD-10-CM | POA: Diagnosis not present

## 2020-01-18 DIAGNOSIS — Z20822 Contact with and (suspected) exposure to covid-19: Secondary | ICD-10-CM | POA: Diagnosis not present

## 2020-01-20 ENCOUNTER — Encounter: Payer: Self-pay | Admitting: Family Medicine

## 2020-01-20 ENCOUNTER — Telehealth (INDEPENDENT_AMBULATORY_CARE_PROVIDER_SITE_OTHER): Payer: BC Managed Care – PPO | Admitting: Family Medicine

## 2020-01-20 DIAGNOSIS — R0982 Postnasal drip: Secondary | ICD-10-CM | POA: Diagnosis not present

## 2020-01-20 MED ORDER — FLUTICASONE PROPIONATE 50 MCG/ACT NA SUSP: 2.0000 | Freq: Every day | NASAL | 6 refills | Status: DC

## 2020-01-20 NOTE — Patient Instructions (Signed)
Postnasal Drip Postnasal drip is the feeling of mucus going down the back of your throat. Mucus is a slimy substance that moistens and cleans your nose and throat, as well as the air pockets in face bones near your forehead and cheeks (sinuses). Small amounts of mucus pass from your nose and sinuses down the back of your throat all the time. This is normal. When you produce too much mucus or the mucus gets too thick, you can feel it. Some common causes of postnasal drip include:  Having more mucus because of: ? A cold or the flu. ? Allergies. ? Cold air. ? Certain medicines.  Having more mucus that is thicker because of: ? A sinus or nasal infection. ? Dry air. ? A food allergy. Follow these instructions at home: Relieving discomfort   Gargle with a salt-water mixture 3-4 times a day or as needed. To make a salt-water mixture, completely dissolve -1 tsp of salt in 1 cup of warm water.  If the air in your home is dry, use a humidifier to add moisture to the air.  Use a saline spray or container (neti pot) to flush out the nose (nasal irrigation). These methods can help clear away mucus and keep the nasal passages moist. General instructions  Take over-the-counter and prescription medicines only as told by your health care provider.  Follow instructions from your health care provider about eating or drinking restrictions. You may need to avoid caffeine.  Avoid things that you know you are allergic to (allergens), like dust, mold, pollen, pets, or certain foods.  Drink enough fluid to keep your urine pale yellow.  Keep all follow-up visits as told by your health care provider. This is important. Contact a health care provider if:  You have a fever.  You have a sore throat.  You have difficulty swallowing.  You have headache.  You have sinus pain.  You have a cough that does not go away.  The mucus from your nose becomes thick and is green or yellow in color.  You have  cold or flu symptoms that last more than 10 days. Summary  Postnasal drip is the feeling of mucus going down the back of your throat.  If your health care provider approves, use nasal irrigation or a nasal spray 2?4 times a day.  Avoid things that you know you are allergic to (allergens), like dust, mold, pollen, pets, or certain foods. This information is not intended to replace advice given to you by your health care provider. Make sure you discuss any questions you have with your health care provider. Document Revised: 10/10/2018 Document Reviewed: 10/01/2016 Elsevier Patient Education  2020 Elsevier Inc.  

## 2020-01-20 NOTE — Progress Notes (Signed)
Patient: [name] MRN: [id number] DOB: [birth year] PCP: Pamela Arnt, MD     I connected with [name] on 01/20/20 at 2:56pm by a video enabled telemedicine application and verified that I am speaking with the correct person using two identifiers.  Location patient: Home Location provider: [facility], Office Persons participating in this virtual visit: [name] and Dr. Rogers Landry  I discussed the limitations of evaluation and management by telemedicine and the availability of in person appointments. The patient expressed understanding and agreed to proceed.   Subjective:  Chief Complaint  Patient presents with  . Post Nasal Drip and Cough    x since last Friday. Daughter was diagnosed with strep throat last week. Patient stated that she has been tested for strep and covid both test were negative. No other symptoms. She mentioned that there is times where she can cough up phlegm. Non persistent cough.     HPI: The patient is a 49 y.o. female who presents today for post nasal drip and cough since last Friday. She has a 17 year old daughter who was diagnosed with strep last week. Negative covid and flu. She developed the post nasal drip and some fatigue. She then went and got a rapid strep and covid at [facility] on Saturday and these were negative. She denies any fever/chills, sinus pain or pressure, shortness of breath. She has not used anything over the counter. Throat is a little sore in the morning from all of the drainage, but gets better. No swollen lymph nodes.   Review of Systems  Constitutional: Negative for chills, diaphoresis, fatigue and fever.  HENT: Positive for postnasal drip. Negative for congestion, ear pain, sinus pressure, sinus pain, sneezing and trouble swallowing.   Eyes: Negative for itching and visual disturbance.  Respiratory: Negative for cough, shortness of breath and wheezing.   Gastrointestinal: Negative for abdominal pain, nausea and vomiting.     Allergies Patient has No Known Allergies.  Past Medical History Patient  has a past medical history of Breast fibroadenoma, left (08/12/2019), Chronic idiopathic pericarditis (05/13/2017), HLD (hyperlipidemia) (09/16/2017), Obesity (BMI 30-39.9) (05/13/2017), Onychomycosis (07/29/2018), and PSVT (paroxysmal supraventricular tachycardia) ([place]) (05/13/2017).  Surgical History Patient  has a past surgical history that includes Pericardial window and Cesarean section.  Family History Pateint's family history includes Arthritis in her mother; Cancer in her maternal grandfather and paternal grandmother; Diabetes in her father; Early death in her father; High Cholesterol in her father.  Social History Patient  reports that she has never smoked. She has never used smokeless tobacco. She reports current alcohol use. She reports that she does not use drugs.    Objective: There were no vitals filed for this visit.  There is no height or weight on file to calculate BMI.  Physical Exam Vitals reviewed.  Constitutional:      Appearance: Normal appearance.  HENT:     Nose:     Comments: No ttp over sinuses  Pulmonary:     Effort: Pulmonary effort is normal.  Neurological:     General: No focal deficit present.     Mental Status: She is alert and oriented to person, place, and time.  Psychiatric:        Mood and Affect: Mood normal.        Behavior: Behavior normal.        Assessment/plan: 1. Post-nasal drip flonase nightly, cool mist humidifier and zyrtec in the day. If worsening symptoms, fever/chills she is to let me know. Discussed  viruses can take 7-10 days to run their course or she may have a component of allergies as well. Conservative therapy only at this time, but again, let me know if not getting better.    Return if symptoms worsen or fail to improve.     Pamela Flaming, MD [place]  01/20/2020

## 2020-03-03 DIAGNOSIS — Z713 Dietary counseling and surveillance: Secondary | ICD-10-CM | POA: Diagnosis not present

## 2020-03-17 ENCOUNTER — Other Ambulatory Visit: Payer: Self-pay

## 2020-03-17 ENCOUNTER — Ambulatory Visit: Payer: BC Managed Care – PPO | Admitting: Family Medicine

## 2020-03-17 ENCOUNTER — Encounter: Payer: Self-pay | Admitting: Family Medicine

## 2020-03-17 VITALS — BP 118/74 | HR 62 | Temp 98.4°F | Wt 217.4 lb

## 2020-03-17 DIAGNOSIS — E782 Mixed hyperlipidemia: Secondary | ICD-10-CM | POA: Insufficient documentation

## 2020-03-17 DIAGNOSIS — Z3041 Encounter for surveillance of contraceptive pills: Secondary | ICD-10-CM | POA: Diagnosis not present

## 2020-03-17 DIAGNOSIS — Z8249 Family history of ischemic heart disease and other diseases of the circulatory system: Secondary | ICD-10-CM

## 2020-03-17 DIAGNOSIS — Z23 Encounter for immunization: Secondary | ICD-10-CM

## 2020-03-17 DIAGNOSIS — N923 Ovulation bleeding: Secondary | ICD-10-CM | POA: Diagnosis not present

## 2020-03-17 DIAGNOSIS — E669 Obesity, unspecified: Secondary | ICD-10-CM

## 2020-03-17 HISTORY — DX: Family history of ischemic heart disease and other diseases of the circulatory system: Z82.49

## 2020-03-17 NOTE — Progress Notes (Signed)
Subjective  CC:  Chief Complaint  Patient presents with   Hyperlipidemia    developed some spotting occasional and light   Dysmenorrhea    HPI: [name] is a 49 y.o. female who presents to the office today to address the problems listed above in the chief complaint.  At [facility] in february, elevated ldl with normal ASCVD score. Pt elected dietary changes with f/u. Here today for labs. She has been working consistently with nutritionist, has cleaned up her diet and has lost weight.  She does have a family history of premature coronary artery disease in her father.  She does not have other risk factors. She would be willing to take a statin if indicated.  She is on oral contraceptives and feels well.  She has regular menstrual cycles.  However the last several months she has had mild intermittent intermenstrual spotting.  Very light.  No pelvic pain.  No vaginal discharge.  Wt Readings from Last 3 Encounters:  03/17/20 217 lb 6.4 oz (98.6 kg)  12/08/19 216 lb 3.2 oz (98.1 kg)  09/03/19 223 lb (101.2 kg)   BP Readings from Last 3 Encounters:  03/17/20 118/74  12/08/19 112/78  09/03/19 106/78   . Lab Results  Component Value Date   CHOL 279 (H) 08/04/2019   CHOL 238 (H) 07/29/2018   CHOL 239 (H) 09/16/2017   Lab Results  Component Value Date   HDL 55.40 08/04/2019   HDL 57.00 07/29/2018   HDL 61.40 09/16/2017   Lab Results  Component Value Date   LDLCALC 197 (H) 08/04/2019   LDLCALC 150 (H) 07/29/2018   LDLCALC 141 (H) 09/16/2017   Lab Results  Component Value Date   TRIG 132.0 08/04/2019   TRIG 152.0 (H) 07/29/2018   TRIG 184.0 (H) 09/16/2017   Lab Results  Component Value Date   CHOLHDL 5 08/04/2019   CHOLHDL 4 07/29/2018   CHOLHDL 4 09/16/2017   No results found for: LDLDIRECT The 10-year ASCVD risk score Mikey Bussing DC Jr., et al., 2013) is: 1.3%   Values used to calculate the score:     Age: 70 years     Sex: Female     Is Non-Hispanic African  American: No     Diabetic: No     Tobacco smoker: No     Systolic Blood Pressure: 366 mmHg     Is BP treated: No     HDL Cholesterol: 55.4 mg/dL     Total Cholesterol: 279 mg/dL   Assessment  1. Moderate mixed hyperlipidemia not requiring statin therapy   2. Family history of early CAD   3. Intermenstrual spotting   4. Oral contraceptive pill surveillance   5. Obesity (BMI 30-39.9)      Plan   Moderate hyperlipidemia: We will recheck fasting lipid panel today.  Hopefully dietary changes have been improved her panel.  Discussed indications for statin.  Continue working with a nutritionist on weight loss and healthy diet.  Reassured, monitor mild intermenstrual spotting.  Return if becomes heavy, recurrent, irregular or associated with pain.  Follow up: Complete physical in February 08/04/2020  Orders Placed This Encounter  Procedures   Lipid panel   No orders of the defined types were placed in this encounter.     I reviewed the patients updated PMH, FH, and SocHx.    Patient Active Problem List   Diagnosis Date Noted   Family history of early CAD 03/17/2020    Priority: High   Moderate  mixed hyperlipidemia not requiring statin therapy 03/17/2020    Priority: High   Chronic idiopathic pericarditis 05/13/2017    Priority: High   Obesity (BMI 30-39.9) 05/13/2017    Priority: High   Breast fibroadenoma, left 08/12/2019    Priority: Medium   Oral contraceptive pill surveillance 08/04/2019    Priority: Medium   PSVT (paroxysmal supraventricular tachycardia) ([place]) 05/13/2017    Priority: Medium   Current Meds  Medication Sig   diltiazem (CARDIZEM) 30 MG tablet Take one tablet by mouth every 6 hours as needed for palpitations.   predniSONE (DELTASONE) 10 MG tablet Take 10 mg by mouth as needed (idiopathic pericarditis). Take as needed.     Allergies: Patient has No Known Allergies. Family History: Patient family history includes Arthritis in her mother;  Cancer in her maternal grandfather and paternal grandmother; Diabetes in her father; Early death in her father; Heart disease in her father; High Cholesterol in her father. Social History:  Patient  reports that she has never smoked. She has never used smokeless tobacco. She reports current alcohol use. She reports that she does not use drugs.  Review of Systems: Constitutional: Negative for fever malaise or anorexia Cardiovascular: negative for chest pain Respiratory: negative for SOB or persistent cough Gastrointestinal: negative for abdominal pain  Objective  Vitals: BP 118/74    Pulse 62    Temp 98.4 F (36.9 C) (Temporal)    Wt 217 lb 6.4 oz (98.6 kg)    SpO2 98%    BMI 33.06 kg/m  General: no acute distress , A&Ox3    Commons side effects, risks, benefits, and alternatives for medications and treatment plan prescribed today were discussed, and the patient expressed understanding of the given instructions. Patient is instructed to call or message via MyChart if he/she has any questions or concerns regarding our treatment plan. No barriers to understanding were identified. We discussed Red Flag symptoms and signs in detail. Patient expressed understanding regarding what to do in case of urgent or emergency type symptoms.   Medication list was reconciled, printed and provided to the patient in AVS. Patient instructions and summary information was reviewed with the patient as documented in the AVS. This note was prepared with assistance of Dragon voice recognition software. Occasional wrong-word or sound-a-like substitutions may have occurred due to the inherent limitations of voice recognition software  This visit occurred during the SARS-CoV-2 public health emergency.  Safety protocols were in place, including screening questions prior to the visit, additional usage of staff PPE, and extensive cleaning of exam room while observing appropriate contact time as indicated for disinfecting  solutions.

## 2020-03-17 NOTE — Patient Instructions (Addendum)
Please return in February for [facility].  I will release your lab results to you on your MyChart account with further instructions. Please reply with any questions.   Today you were given your flu vaccination.    If you have any questions or concerns, please don't hesitate to send me a message via MyChart or call the office at [phone]. Thank you for visiting with [place] today! It's our pleasure caring for you.

## 2020-03-18 LAB — LIPID PANEL
Cholesterol: 239 mg/dL — ABNORMAL HIGH (ref <200)
HDL: 60 mg/dL (ref >=50)
LDL Cholesterol (Calc): 156 mg/dL (calc) — ABNORMAL HIGH
Non-HDL Cholesterol (Calc): 179 mg/dL (calc) — ABNORMAL HIGH (ref <130)
Total CHOL/HDL Ratio: 4 (calc) (ref <5.0)
Triglycerides: 116 mg/dL (ref <150)

## 2020-03-24 ENCOUNTER — Ambulatory Visit: Payer: BC Managed Care – PPO | Admitting: Cardiology

## 2020-03-24 ENCOUNTER — Encounter: Payer: Self-pay | Admitting: Cardiology

## 2020-03-24 ENCOUNTER — Other Ambulatory Visit: Payer: Self-pay

## 2020-03-24 VITALS — BP 108/68 | HR 86 | Ht 68.0 in | Wt 218.6 lb

## 2020-03-24 DIAGNOSIS — I471 Supraventricular tachycardia: Secondary | ICD-10-CM

## 2020-03-24 MED ORDER — DILTIAZEM HCL 30 MG PO TABS: ORAL_TABLET | ORAL | 3 refills | Status: DC

## 2020-03-24 MED ORDER — DILTIAZEM HCL ER COATED BEADS 240 MG PO CP24: 240.0000 mg | ORAL_CAPSULE | Freq: Every day | ORAL | 3 refills | Status: DC

## 2020-03-24 NOTE — Progress Notes (Signed)
Electrophysiology Office Note   Date:  03/24/2020   ID:  [name] 07/11/1970, MRN [id number]  PCP:  Pamela Arnt, MD  Cardiologist:  [name] Primary Electrophysiologist:  Pamela Vespa Meredith Leeds, MD    No chief complaint on file.    History of Present Illness: [name] is a 49 y.o. female who is being seen today for the evaluation of [name] at the request of [name]. She is a Pharmacist, hospital at [employer]. Presenting today for electrophysiology evaluation.  She has a history of pericarditis status post pericardial window.  She was seen by general cardiology with [name].  She has been followed for recurrent idiopathic pericarditis in the past in [place].  She was diagnosed in 2008.  At that time, she had a pericardial window at [facility].  She had been treated with methotrexate in the past.  She also has [name].  She was living in [place] 2 years ago when she noted [name].    She had previously been on as needed diltiazem, though she started to have more frequent episodes after having Covid October 2020..  Due to that, she was started on long-acting diltiazem.    Today, denies symptoms of palpitations, chest pain, shortness of breath, orthopnea, PND, lower extremity edema, claudication, dizziness, presyncope, syncope, bleeding, or neurologic sequela. The patient is tolerating medications without difficulties.  Since last being seen she has done well.  Since starting the diltiazem, she continues to have [name] episodes on a daily basis, though they are lasting all less than 1 minute.  She did have a day where she forgot to take her diltiazem and she had an episode that lasted up to 7 minutes and spontaneously terminated.  She is overall comfortable with her control, feeling much better than she was last time she was seen.     Past Medical History:  Diagnosis Date  . Breast fibroadenoma, left 08/12/2019   biospy 08/2019  . Chronic idiopathic pericarditis 05/13/2017  . Family history of  early CAD 03-19-2020   Father died age 53  . HLD (hyperlipidemia) 09/16/2017  . Obesity (BMI 30-39.9) 05/13/2017  . Onychomycosis 07/29/2018  . PSVT (paroxysmal supraventricular tachycardia) ([place]) 05/13/2017   Past Surgical History:  Procedure Laterality Date  . CESAREAN SECTION    . PERICARDIAL WINDOW       Current Outpatient Medications  Medication Sig Dispense Refill  . diltiazem (CARDIZEM CD) 240 MG 24 hr capsule Take 1 capsule (240 mg total) by mouth daily. 60 capsule 3  . diltiazem (CARDIZEM) 30 MG tablet Take one tablet by mouth every 6 hours as needed for palpitations. 30 tablet 6  . norgestimate-ethinyl estradiol (PREVIFEM) 0.25-35 MG-MCG tablet Take 1 tablet by mouth daily. 1 Package 11  . predniSONE (DELTASONE) 10 MG tablet Take 10 mg by mouth as needed (idiopathic pericarditis). Take as needed.      No current facility-administered medications for this visit.    Allergies:   Patient has no known allergies.   Social History:  The patient  reports that she has never smoked. She has never used smokeless tobacco. She reports current alcohol use. She reports that she does not use drugs.   Family History:  The patient's family history includes Arthritis in her mother; Cancer in her maternal grandfather and paternal grandmother; Diabetes in her father; Early death in her father; Heart disease in her father; High Cholesterol in her father.    ROS:  Please see the history of present illness.  Otherwise, review of systems is positive for none.   All other systems are reviewed and negative.   PHYSICAL EXAM: VS:  BP 108/68   Pulse 86   Ht 5\' 8"  (1.727 m)   Wt 218 lb 9.6 oz (99.2 kg)   LMP 03/14/2020   SpO2 97%   BMI 33.24 kg/m  , BMI Body mass index is 33.24 kg/m. GEN: Well nourished, well developed, in no acute distress  HEENT: normal  Neck: no JVD, carotid bruits, or masses Cardiac: RRR; no murmurs, rubs, or gallops,no edema  Respiratory:  clear to auscultation  bilaterally, normal work of breathing GI: soft, nontender, nondistended, + BS MS: no deformity or atrophy  Skin: warm and dry Neuro:  Strength and sensation are intact Psych: euthymic mood, full affect  EKG:  EKG is ordered today. Personal review of the ekg ordered shows sinus rhythm at 71   Recent Labs: 08/04/2019: ALT 12; BUN 13; Creatinine, Ser 0.77; Hemoglobin 14.3; Platelets 290.0; Potassium 4.5; Sodium 137; TSH 0.66    Lipid Panel     Component Value Date/Time   CHOL 239 (H) 03/17/2020 1145   TRIG 116 03/17/2020 1145   HDL 60 03/17/2020 1145   CHOLHDL 4.0 03/17/2020 1145   VLDL 26.4 08/04/2019 1532   LDLCALC 156 (H) 03/17/2020 1145     Wt Readings from Last 3 Encounters:  03/24/20 218 lb 9.6 oz (99.2 kg)  03/17/20 217 lb 6.4 oz (98.6 kg)  12/08/19 216 lb 3.2 oz (98.1 kg)      Other studies Reviewed: Additional studies/ records that were reviewed today include: TTE 11/29/17  Review of the above records today demonstrates:  - Left ventricle: The cavity size was normal. Systolic function was   normal. The estimated ejection fraction was in the range of 55%   to 60%. Wall motion was normal; there were no regional wall   motion abnormalities. Left ventricular diastolic function   parameters were normal. - Atrial septum: No defect or patent foramen ovale was identified.  30 day monitor 11/29/17 - personally reviewed Sinus rhythm Premature ventricular contractions Several episodes of supraventricular tachycardia ([name])  ASSESSMENT AND PLAN:  1.  [name]: Likely due to AVNRT.  Is currently on diltiazem.  She continues to have episodes of [name] on a daily basis that last up to 30 seconds.  She is comfortable with her overall control.  No changes.  2.  Chronic pericarditis: No current chest pain.  Plan per primary cardiology.  Current medicines are reviewed at length with the patient today.   The patient does not have concerns regarding her medicines.  The following changes  were made today: None  Labs/ tests ordered today include:  Orders Placed This Encounter  Procedures  . EKG 12-Lead    Disposition:   FU with [name] 12 months  Signed, Yeilyn Gent Meredith Leeds, MD  03/24/2020 9:17 AM     [facility] [address] [place] [address] [phone] (office) [phone] (fax)

## 2020-03-24 NOTE — Patient Instructions (Signed)
Medication Instructions:  Your physician recommends that you continue on your current medications as directed. Please refer to the Current Medication list given to you today.  *If you need a refill on your cardiac medications before your next appointment, please call your pharmacy*   Lab Work: None ordered   Testing/Procedures: None ordered   Follow-Up: At [facility], you and your health needs are our priority.  As part of our continuing mission to provide you with exceptional heart care, we have created designated Provider Care Teams.  These Care Teams include your primary Cardiologist (physician) and Advanced Practice Providers (APPs -  Physician Assistants and Nurse Practitioners) who all work together to provide you with the care you need, when you need it.  We recommend signing up for the patient portal called "MyChart".  Sign up information is provided on this After Visit Summary.  MyChart is used to connect with patients for Virtual Visits (Telemedicine).  Patients are able to view lab/test results, encounter notes, upcoming appointments, etc.  Non-urgent messages can be sent to your provider as well.   To learn more about what you can do with MyChart, go to [email or web address].    Your next appointment:   1 year(s)  The format for your next appointment:   In Person  Provider:   Allegra Lai, MD

## 2020-04-14 DIAGNOSIS — Z713 Dietary counseling and surveillance: Secondary | ICD-10-CM | POA: Diagnosis not present

## 2020-04-25 ENCOUNTER — Ambulatory Visit: Payer: BC Managed Care – PPO | Admitting: Podiatry

## 2020-04-25 ENCOUNTER — Encounter: Payer: Self-pay | Admitting: Podiatry

## 2020-04-25 ENCOUNTER — Other Ambulatory Visit: Payer: Self-pay

## 2020-04-25 DIAGNOSIS — S90422A Blister (nonthermal), left great toe, initial encounter: Secondary | ICD-10-CM | POA: Diagnosis not present

## 2020-04-25 DIAGNOSIS — B351 Tinea unguium: Secondary | ICD-10-CM

## 2020-04-25 MED ORDER — NONFORMULARY OR COMPOUNDED ITEM: 3 refills | Status: DC

## 2020-04-25 NOTE — Progress Notes (Signed)
Subjective:   Patient ID: [name], female   DOB: 50 y.o.   MRN: [id number]   HPI 49 year old female presents the office today for concerns of toenail fungus her left big toe.  Is been ongoing for quite some time but she not had any treatment for it.  Over the last couple weeks she noticed blood spot adjacent to the toenail.  Denies any drainage or pus or any swelling or redness.  She did have a treatment for this either.  No other concerns today.   Review of Systems  All other systems reviewed and are negative.  Past Medical History:  Diagnosis Date  . Breast fibroadenoma, left 08/12/2019   biospy 08/2019  . Chronic idiopathic pericarditis 05/13/2017  . Family history of early CAD 03/31/20   Father died age 67  . HLD (hyperlipidemia) 09/16/2017  . Obesity (BMI 30-39.9) 05/13/2017  . Onychomycosis 07/29/2018  . PSVT (paroxysmal supraventricular tachycardia) ([place]) 05/13/2017    Past Surgical History:  Procedure Laterality Date  . CESAREAN SECTION    . PERICARDIAL WINDOW       Current Outpatient Medications:  .  diltiazem (CARDIZEM CD) 240 MG 24 hr capsule, Take 1 capsule (240 mg total) by mouth daily., Disp: 90 capsule, Rfl: 3 .  diltiazem (CARDIZEM) 30 MG tablet, Take one tablet by mouth every 6 hours as needed for palpitations., Disp: 30 tablet, Rfl: 3 .  NONFORMULARY OR COMPOUNDED ITEM, Antifungal solution: Terbinafine 3%, Fluconazole 2%, Tea Tree Oil 5%, Urea 10%, Ibuprofen 2% in DMSO suspension #37mL, Disp: 1 each, Rfl: 3 .  norgestimate-ethinyl estradiol (PREVIFEM) 0.25-35 MG-MCG tablet, Take 1 tablet by mouth daily., Disp: 1 Package, Rfl: 11 .  predniSONE (DELTASONE) 10 MG tablet, Take 10 mg by mouth as needed (idiopathic pericarditis). Take as needed. , Disp: , Rfl:   No Known Allergies       Objective:  Physical Exam  General: AAO x3, NAD  Dermatological: Left hallux was hypertrophic, dystrophic with yellow-brown discoloration.  On the distal medial aspect of  his foot appears to be a dry flat hemorrhagic blister.  There is no drainage or pus there is well defined borders.  No open lesions no drainage or pus.    Vascular: Dorsalis Pedis artery and Posterior Tibial artery pedal pulses are 2/4 bilateral with immedate capillary fill time. There is no pain with calf compression, swelling, warmth, erythema.   Neruologic: Grossly intact via light touch bilateral.   Musculoskeletal: No gross boney pedal deformities bilateral. No pain, crepitus, or limitation noted with foot and ankle range of motion bilateral. Muscular strength 5/5 in all groups tested bilateral.  Gait: Unassisted, Nonantalgic.       Assessment:   49 year old female left hallux onychomycosis, likely foot hemorrhagic blister    Plan:  -Treatment options discussed including all alternatives, risks, and complications -Etiology of symptoms were discussed -Discussed treatment options both orally and topically for the nail fungus.  She wants to hold off on oral medication.  [facility]  Discussed biotin supplement as well.  Consider laser therapy.  Will monitor the dark spot likely flat blister.  If no improvement or worsening will further evaluate.  Trula Slade DPM

## 2020-04-25 NOTE — Patient Instructions (Signed)
I have ordered a medication for you that will come from [facility] in [place]. They should be calling you to verify insurance and will mail the medication to you. If you live close by then you can go by their pharmacy to pick up the medication. Their phone number is [phone]. If you do not hear from them in the next few days, please give [place] a call at [phone].   Onychomycosis/Fungal Toenails  WHAT IS IT? An infection that lies within the keratin of your nail plate that is caused by a fungus.  WHY ME? Fungal infections affect all ages, sexes, races, and creeds.  There may be many factors that predispose you to a fungal infection such as age, coexisting medical conditions such as diabetes, or an autoimmune disease; stress, medications, fatigue, genetics, etc.  Bottom line: fungus thrives in a warm, moist environment and your shoes offer such a location.  IS IT CONTAGIOUS? Theoretically, yes.  You do not want to share shoes, nail clippers or files with someone who has fungal toenails.  Walking around barefoot in the same room or sleeping in the same bed is unlikely to transfer the organism.  It is important to realize, however, that fungus can spread easily from one nail to the next on the same foot.  HOW DO WE TREAT THIS?  There are several ways to treat this condition.  Treatment may depend on many factors such as age, medications, pregnancy, liver and kidney conditions, etc.  It is best to ask your doctor which options are available to you.  1. No treatment.   Unlike many other medical concerns, you can live with this condition.  However for many people this can be a painful condition and may lead to ingrown toenails or a bacterial infection.  It is recommended that you keep the nails cut short to help reduce the amount of fungal nail. 2. Topical treatment.  These range from herbal remedies to prescription strength nail lacquers.  About 40-50% effective, topicals require twice daily  application for approximately 9 to 12 months or until an entirely new nail has grown out.  The most effective topicals are medical grade medications available through physicians offices. 3. Oral antifungal medications.  With an 80-90% cure rate, the most common oral medication requires 3 to 4 months of therapy and stays in your system for a year as the new nail grows out.  Oral antifungal medications do require blood work to make sure it is a safe drug for you.  A liver function panel will be performed prior to starting the medication and after the first month of treatment.  It is important to have the blood work performed to avoid any harmful side effects.  In general, this medication safe but blood work is required. 4. Laser Therapy.  This treatment is performed by applying a specialized laser to the affected nail plate.  This therapy is noninvasive, fast, and non-painful.  It is not covered by insurance and is therefore, out of pocket.  The results have been very good with a 80-95% cure rate.  [facility] is the only practice in the area to offer this therapy. 5. Permanent Nail Avulsion.  Removing the entire nail so that a new nail will not grow back.

## 2020-05-02 ENCOUNTER — Ambulatory Visit (INDEPENDENT_AMBULATORY_CARE_PROVIDER_SITE_OTHER): Payer: BC Managed Care – PPO | Admitting: *Deleted

## 2020-05-02 ENCOUNTER — Other Ambulatory Visit: Payer: Self-pay

## 2020-05-02 DIAGNOSIS — B351 Tinea unguium: Secondary | ICD-10-CM

## 2020-05-02 NOTE — Progress Notes (Signed)
Patient presents today for the 1st laser treatment. Diagnosed with mycotic nail infection by Dr. Jacqualyn Posey.   Toenail most affected is the hallux left.  All other systems are negative.  Nails were filed thin. Laser therapy was administered to 1st toenails left and patient tolerated the treatment well. All safety precautions were in place.   She is also using a topical antifungal compound once a day.  Follow up in 4 weeks for laser # 2.  ~Get nail pic next visit

## 2020-05-02 NOTE — Patient Instructions (Signed)

## 2020-05-27 ENCOUNTER — Other Ambulatory Visit: Payer: Self-pay | Admitting: Physician Assistant

## 2020-05-27 DIAGNOSIS — N926 Irregular menstruation, unspecified: Secondary | ICD-10-CM

## 2020-06-10 ENCOUNTER — Other Ambulatory Visit: Payer: Self-pay

## 2020-06-10 ENCOUNTER — Ambulatory Visit (INDEPENDENT_AMBULATORY_CARE_PROVIDER_SITE_OTHER): Payer: BC Managed Care – PPO | Admitting: *Deleted

## 2020-06-10 DIAGNOSIS — B351 Tinea unguium: Secondary | ICD-10-CM

## 2020-06-10 NOTE — Progress Notes (Signed)
Patient presents today for the 2nd laser treatment. Diagnosed with mycotic nail infection by Dr. Jacqualyn Posey.   Toenail most affected is the hallux left.  All other systems are negative.  Nails were filed thin. Laser therapy was administered to 1st toenails left and patient tolerated the treatment well. All safety precautions were in place.   She is also using a topical antifungal compound once a day.  Follow up in 4 weeks for laser # 3.   Pic of nail took today and added to chart.

## 2020-07-08 ENCOUNTER — Other Ambulatory Visit: Payer: BC Managed Care – PPO

## 2020-07-15 ENCOUNTER — Ambulatory Visit (INDEPENDENT_AMBULATORY_CARE_PROVIDER_SITE_OTHER): Payer: BC Managed Care – PPO | Admitting: *Deleted

## 2020-07-15 ENCOUNTER — Other Ambulatory Visit: Payer: Self-pay

## 2020-07-15 ENCOUNTER — Encounter: Payer: Self-pay | Admitting: *Deleted

## 2020-07-15 DIAGNOSIS — B351 Tinea unguium: Secondary | ICD-10-CM

## 2020-07-15 NOTE — Progress Notes (Signed)
Patient presents today for the 3rd laser treatment. Diagnosed with mycotic nail infection by Dr. Jacqualyn Posey.   Toenail most affected is the hallux left. New growth is starting to show.  All other systems are negative.  Nails were filed thin. Laser therapy was administered to 1st toenails left and patient tolerated the treatment well. All safety precautions were in place.   She is also using a topical antifungal compound once a day.  Follow up in 6 weeks for laser # 4.

## 2020-08-04 ENCOUNTER — Encounter: Payer: Self-pay | Admitting: Family Medicine

## 2020-08-04 ENCOUNTER — Other Ambulatory Visit: Payer: Self-pay

## 2020-08-04 ENCOUNTER — Ambulatory Visit (INDEPENDENT_AMBULATORY_CARE_PROVIDER_SITE_OTHER): Payer: BC Managed Care – PPO | Admitting: Family Medicine

## 2020-08-04 VITALS — BP 118/62 | HR 76 | Temp 97.8°F | Resp 16 | Ht 68.0 in | Wt 224.0 lb

## 2020-08-04 DIAGNOSIS — E782 Mixed hyperlipidemia: Secondary | ICD-10-CM

## 2020-08-04 DIAGNOSIS — Z1159 Encounter for screening for other viral diseases: Secondary | ICD-10-CM | POA: Diagnosis not present

## 2020-08-04 DIAGNOSIS — I319 Disease of pericardium, unspecified: Secondary | ICD-10-CM

## 2020-08-04 DIAGNOSIS — I471 Supraventricular tachycardia: Secondary | ICD-10-CM | POA: Diagnosis not present

## 2020-08-04 DIAGNOSIS — Z3041 Encounter for surveillance of contraceptive pills: Secondary | ICD-10-CM | POA: Diagnosis not present

## 2020-08-04 DIAGNOSIS — E669 Obesity, unspecified: Secondary | ICD-10-CM

## 2020-08-04 DIAGNOSIS — Z1211 Encounter for screening for malignant neoplasm of colon: Secondary | ICD-10-CM

## 2020-08-04 DIAGNOSIS — Z0000 Encounter for general adult medical examination without abnormal findings: Secondary | ICD-10-CM | POA: Diagnosis not present

## 2020-08-04 DIAGNOSIS — D242 Benign neoplasm of left breast: Secondary | ICD-10-CM

## 2020-08-04 DIAGNOSIS — Z1212 Encounter for screening for malignant neoplasm of rectum: Secondary | ICD-10-CM

## 2020-08-04 DIAGNOSIS — Z8249 Family history of ischemic heart disease and other diseases of the circulatory system: Secondary | ICD-10-CM

## 2020-08-04 LAB — CBC WITH DIFFERENTIAL/PLATELET
Basophils Absolute: 0 10*3/uL (ref 0.0–0.1)
Basophils Relative: 0.6 % (ref 0.0–3.0)
Eosinophils Absolute: 0.2 10*3/uL (ref 0.0–0.7)
Eosinophils Relative: 1.8 % (ref 0.0–5.0)
HCT: 41.5 % (ref 36.0–46.0)
Hemoglobin: 13.8 g/dL (ref 12.0–15.0)
Lymphocytes Relative: 23.7 % (ref 12.0–46.0)
Lymphs Abs: 2 10*3/uL (ref 0.7–4.0)
MCHC: 33.2 g/dL (ref 30.0–36.0)
MCV: 83.3 fl (ref 78.0–100.0)
Monocytes Absolute: 0.4 10*3/uL (ref 0.1–1.0)
Monocytes Relative: 4.5 % (ref 3.0–12.0)
Neutro Abs: 5.8 10*3/uL (ref 1.4–7.7)
Neutrophils Relative %: 69.4 % (ref 43.0–77.0)
Platelets: 263 10*3/uL (ref 150.0–400.0)
RBC: 4.98 Mil/uL (ref 3.87–5.11)
RDW: 12.6 % (ref 11.5–15.5)
WBC: 8.4 10*3/uL (ref 4.0–10.5)

## 2020-08-04 LAB — COMPREHENSIVE METABOLIC PANEL
ALT: 8 U/L (ref 0–35)
AST: 13 U/L (ref 0–37)
Albumin: 3.9 g/dL (ref 3.5–5.2)
Alkaline Phosphatase: 55 U/L (ref 39–117)
BUN: 13 mg/dL (ref 6–23)
CO2: 27 mEq/L (ref 19–32)
Calcium: 9.2 mg/dL (ref 8.4–10.5)
Chloride: 104 mEq/L (ref 96–112)
Creatinine, Ser: 0.65 mg/dL (ref 0.40–1.20)
GFR: 104.67 mL/min (ref >60.00)
Glucose, Bld: 77 mg/dL (ref 70–99)
Potassium: 3.8 mEq/L (ref 3.5–5.1)
Sodium: 137 mEq/L (ref 135–145)
Total Bilirubin: 0.5 mg/dL (ref 0.2–1.2)
Total Protein: 7.1 g/dL (ref 6.0–8.3)

## 2020-08-04 LAB — LIPID PANEL
Cholesterol: 228 mg/dL — ABNORMAL HIGH (ref 0–200)
HDL: 59.4 mg/dL (ref >39.00)
LDL Cholesterol: 144 mg/dL — ABNORMAL HIGH (ref 0–99)
NonHDL: 168.26
Total CHOL/HDL Ratio: 4
Triglycerides: 122 mg/dL (ref 0.0–149.0)
VLDL: 24.4 mg/dL (ref 0.0–40.0)

## 2020-08-04 LAB — TSH: TSH: 0.62 u[IU]/mL (ref 0.35–4.50)

## 2020-08-04 NOTE — Progress Notes (Signed)
Subjective  Chief Complaint  Patient presents with  . Annual Exam    Fasting     HPI: [name] is a 50 y.o. female who presents to [place] [facility] today for a Female Wellness Visit. She also has the concerns and/or needs as listed above in the chief complaint. These will be addressed in addition to the Health Maintenance Visit.   Wellness Visit: annual visit with health maintenance review and exam without Pap   Health maintenance: Screens are current and up-to-date.  She is eligible for colon cancer screening with colonoscopy.  She is interested if her insurance covers it.  Average risk patient.  Continues to struggle with weight loss.  Admits to stress eating.  Not currently controlled with her job.  No mood symptoms or disorder.  Home life is excellent.  Would like to start working out again.  Chronic disease f/u and/or acute problem visit: (deemed necessary to be done in addition to the wellness visit):  Chronic pericarditis and PSVT: Reviewed cardiology notes.  She is stable on Cardizem.  No hypotensive symptoms.  Palpitations are fairly well controlled.  Not needing as needed doses.  No chest pain recently.  No shortness of breath.  No lower extremity edema.  OCP use: Regular menstrual cycles.  No adverse effects.  No contraindications to estrogens.  Had abnormal mammogram last year, s/p biopsy which showed breast fibroadenoma in the left.  Mixed hyperlipidemia with family history of premature coronary disease.  Fasting for recheck today.  Assessment  1. Annual physical exam   2. Chronic idiopathic pericarditis, unspecified complication status   3. Family history of early CAD   79. Moderate mixed hyperlipidemia not requiring statin therapy   5. PSVT (paroxysmal supraventricular tachycardia) ([place])   6. Oral contraceptive pill surveillance   7. Breast fibroadenoma, left   8. Obesity (BMI 30-39.9)   9. Need for hepatitis C screening test      Plan   Female Wellness Visit:  Age appropriate Health Maintenance and Prevention measures were discussed with patient. Included topics are cancer screening recommendations, ways to keep healthy (see AVS) including dietary and exercise recommendations, regular eye and dental care, use of seat belts, and avoidance of moderate alcohol use and tobacco use.   BMI: discussed patient's BMI and encouraged positive lifestyle modifications to help get to or maintain a target BMI.  HM needs and immunizations were addressed and ordered. See below for orders. See HM and immunization section for updates.  Routine labs and screening tests ordered including cmp, cbc and lipids where appropriate.  Discussed recommendations regarding Vit D and calcium supplementation (see AVS)  Chronic disease management visit and/or acute problem visit:  Pericarditis and PSVT are stable.  Continue Cardizem 240 mg daily.  No recent flares of pericarditis.  Has not needed prednisone.  To be used only as needed.  Blood pressure stable.  Moderate mixed hyperlipidemia and family history of early CAD: Counseling education done.  Continue to work on low-cholesterol diet.  Work on weight loss.  Will recheck lipids.  Unlikely to need statin at this time but could qualify in the future.  All contraceptive use: Stable.  Continue daily.  Breast fibroadenoma: Due for mammogram in March.  Has no questions.   Follow up: 12 months for your complete annual physical exam with blood work. Please come fasting. Orders Placed This Encounter  Procedures  . Hepatitis C antibody  . Comprehensive metabolic panel  . CBC with Differential/Platelet  .  Lipid panel  . TSH   No orders of the defined types were placed in this encounter.     Body mass index is 34.06 kg/m. Wt Readings from Last 3 Encounters:  08/04/20 224 lb (101.6 kg)  03/24/20 218 lb 9.6 oz (99.2 kg)  03/17/20 217 lb 6.4 oz (98.6 kg)     Patient Active Problem List    Diagnosis Date Noted  . Family history of early CAD 03/17/2020    Priority: High    Father MI 64, died age 25   . Moderate mixed hyperlipidemia not requiring statin therapy 03/17/2020    Priority: High  . Chronic idiopathic pericarditis 05/13/2017    Priority: High  . Obesity (BMI 30-39.9) 05/13/2017    Priority: High  . Breast fibroadenoma, left 08/12/2019    Priority: Medium    biospy 08/2019   . Oral contraceptive pill surveillance 08/04/2019    Priority: Medium  . PSVT (paroxysmal supraventricular tachycardia) ([place]) 05/13/2017    Priority: Medium   Health Maintenance  Topic Date Due  . Hepatitis C Screening  Never done  . COLONOSCOPY (Pts 45-15yr Insurance coverage will need to be confirmed)  Never done  . COVID-19 Vaccine (4 - Booster for Moderna series) 11/05/2020  . MAMMOGRAM  08/03/2021  . PAP SMEAR-Modifier  05/13/2022  . TETANUS/TDAP  08/19/2022  . INFLUENZA VACCINE  Completed  . HIV Screening  Discontinued   Immunization History  Administered Date(s) Administered  . Influenza, Quadrivalent, Recombinant, Inj, Pf 05/17/2019  . Influenza,inj,Quad PF,6+ Mos 04/18/2017, 04/18/2018, 03/17/2020  . MMR 06/26/2018  . Moderna Sars-Covid-2 Vaccination 09/04/2019, 10/02/2019, 05/08/2020  . Tdap 08/19/2012   We updated and reviewed the patient's past history in detail and it is documented below. Allergies: Patient has No Known Allergies. Past Medical History Patient  has a past medical history of Breast fibroadenoma, left (08/12/2019), Chronic idiopathic pericarditis (05/13/2017), Family history of early CAD (03/17/2020), HLD (hyperlipidemia) (09/16/2017), Obesity (BMI 30-39.9) (05/13/2017), Onychomycosis (07/29/2018), and PSVT (paroxys[place]) [place]05/13/[place]). Past Surgical History Patient  has a past surgical history that includes Pericardial window and Cesarean section. Family History: Patient family history includes Arthritis in her mother;  Cancer in her maternal grandfather and paternal grandmother; Diabetes in her father; Early death in her father; Heart disease in her father; High Cholesterol in her father. Social History:  Patient  reports that she has never smoked. She has never used smokeless tobacco. She reports current alcohol use. She reports that she does not use drugs.  Review of Systems: Constitutional: negative for fever or malaise Ophthalmic: negative for photophobia, double vision or loss of vision Cardiovascular: negative for chest pain, dyspnea on exertion, or new LE swelling Respiratory: negative for SOB or persistent cough Gastrointestinal: negative for abdominal pain, change in bowel habits or melena Genitourinary: negative for dysuria or gross hematuria, no abnormal uterine bleeding or disharge Musculoskeletal: negative for new gait disturbance or muscular weakness Integumentary: negative for new or persistent rashes, no breast lumps Neurological: negative for TIA or stroke symptoms Psychiatric: negative for SI or delusions Allergic/Immunologic: negative for hives  Patient Care Team    Relationship Specialty Notifications Start End  ALeamon Arnt MD PCP - General Family Medicine  08/04/19   CConstance Haw MD PCP - Electrophysiology Cardiology Admissions 01/22/18   MBurnell Blanks MD Consulting Physician Cardiology  08/04/19     Objective  Vitals: BP 118/62   Pulse 76   Temp 97.8 F (36.6 C) (Temporal)  Resp 16   Ht 5' 8" (1.727 m)   Wt 224 lb (101.6 kg)   LMP 07/21/2020   SpO2 99%   BMI 34.06 kg/m  General:  Well developed, well nourished, no acute distress  Psych:  Alert and orientedx3,normal mood and affect HEENT:  Normocephalic, atraumatic, non-icteric sclera,  supple neck without adenopathy, mass or thyromegaly Cardiovascular:  Normal S1, S2, RRR without gallop, rub or murmur Respiratory:  Good breath sounds bilaterally, CTAB with normal respiratory  effort Gastrointestinal: normal bowel sounds, soft, non-tender, no noted masses. No HSM MSK: no deformities, contusions. Joints are without erythema or swelling.  Skin:  Warm, no rashes or suspicious lesions noted Neurologic:    Mental status is normal. CN 2-11 are normal. Gross motor and sensory exams are normal. Normal gait. No tremor Breast Exam: No mass, skin retraction or nipple discharge is appreciated in either breast. No axillary adenopathy. Fibrocystic changes are not noted    Commons side effects, risks, benefits, and alternatives for medications and treatment plan prescribed today were discussed, and the patient expressed understanding of the given instructions. Patient is instructed to call or message via MyChart if he/she has any questions or concerns regarding our treatment plan. No barriers to understanding were identified. We discussed Red Flag symptoms and signs in detail. Patient expressed understanding regarding what to do in case of urgent or emergency type symptoms.   Medication list was reconciled, printed and provided to the patient in AVS. Patient instructions and summary information was reviewed with the patient as documented in the AVS. This note was prepared with assistance of Dragon voice recognition software. Occasional wrong-word or sound-a-like substitutions may have occurred due to the inherent limitations of voice recognition software  This visit occurred during the SARS-CoV-2 public health emergency.  Safety protocols were in place, including screening questions prior to the visit, additional usage of staff PPE, and extensive cleaning of exam room while observing appropriate contact time as indicated for disinfecting solutions.     

## 2020-08-04 NOTE — Patient Instructions (Signed)
Please return in 12 months for your annual complete physical; please come fasting.  I will release your lab results to you on your MyChart account with further instructions. Please reply with any questions.   Please ensure your insurance covers screening colonoscopy at your age. I have referred you to GI to get started on this if it does.   If you have any questions or concerns, please don't hesitate to send me a message via MyChart or call the office at [phone]. Thank you for visiting with [place] today! It's our pleasure caring for you.   Preventive Care 55-63 Years Old, Female Preventive care refers to lifestyle choices and visits with your health care provider that can promote health and wellness. This includes:  A yearly physical exam. This is also called an annual wellness visit.  Regular dental and eye exams.  Immunizations.  Screening for certain conditions.  Healthy lifestyle choices, such as: ? Eating a healthy diet. ? Getting regular exercise. ? Not using drugs or products that contain nicotine and tobacco. ? Limiting alcohol use. What can I expect for my preventive care visit? Physical exam Your health care provider will check your:  Height and weight. These may be used to calculate your BMI (body mass index). BMI is a measurement that tells if you are at a healthy weight.  Heart rate and blood pressure.  Body temperature.  Skin for abnormal spots. Counseling Your health care provider may ask you questions about your:  Past medical problems.  Family's medical history.  Alcohol, tobacco, and drug use.  Emotional well-being.  Home life and relationship well-being.  Sexual activity.  Diet, exercise, and sleep habits.  Work and work Astronomer.  Access to firearms.  Method of birth control.  Menstrual cycle.  Pregnancy history. What immunizations do I need? Vaccines are usually given at various ages, according to a schedule. Your health care  provider will recommend vaccines for you based on your age, medical history, and lifestyle or other factors, such as travel or where you work.   What tests do I need? Blood tests  Lipid and cholesterol levels. These may be checked every 5 years, or more often if you are over 24 years old.  Hepatitis C test.  Hepatitis B test. Screening  Lung cancer screening. You may have this screening every year starting at age 8 if you have a 30-pack-year history of smoking and currently smoke or have quit within the past 15 years.  Colorectal cancer screening. ? All adults should have this screening starting at age 48 and continuing until age 59. ? Your health care provider may recommend screening at age 48 if you are at increased risk. ? You will have tests every 1-10 years, depending on your results and the type of screening test.  Diabetes screening. ? This is done by checking your blood sugar (glucose) after you have not eaten for a while (fasting). ? You may have this done every 1-3 years.  Mammogram. ? This may be done every 1-2 years. ? Talk with your health care provider about when you should start having regular mammograms. This may depend on whether you have a family history of breast cancer.  BRCA-related cancer screening. This may be done if you have a family history of breast, ovarian, tubal, or peritoneal cancers.  Pelvic exam and Pap test. ? This may be done every 3 years starting at age 67. ? Starting at age 16, this may be done every 5 years if  you have a Pap test in combination with an HPV test. Other tests  STD (sexually transmitted disease) testing, if you are at risk.  Bone density scan. This is done to screen for osteoporosis. You may have this scan if you are at high risk for osteoporosis. Talk with your health care provider about your test results, treatment options, and if necessary, the need for more tests. Follow these instructions at home: Eating and  drinking  Eat a diet that includes fresh fruits and vegetables, whole grains, lean protein, and low-fat dairy products.  Take vitamin and mineral supplements as recommended by your health care provider.  Do not drink alcohol if: ? Your health care provider tells you not to drink. ? You are pregnant, may be pregnant, or are planning to become pregnant.  If you drink alcohol: ? Limit how much you have to 0-1 drink a day. ? Be aware of how much alcohol is in your drink. In the [place] one drink equals one 12 oz bottle of beer (355 mL), one 5 oz glass of wine (148 mL), or one 1 oz glass of hard liquor (44 mL).   Lifestyle  Take daily care of your teeth and gums. Brush your teeth every morning and night with fluoride toothpaste. Floss one time each day.  Stay active. Exercise for at least 30 minutes 5 or more days each week.  Do not use any products that contain nicotine or tobacco, such as cigarettes, e-cigarettes, and chewing tobacco. If you need help quitting, ask your health care provider.  Do not use drugs.  If you are sexually active, practice safe sex. Use a condom or other form of protection to prevent STIs (sexually transmitted infections).  If you do not wish to become pregnant, use a form of birth control. If you plan to become pregnant, see your health care provider for a prepregnancy visit.  If told by your health care provider, take low-dose aspirin daily starting at age 28.  Find healthy ways to cope with stress, such as: ? Meditation, yoga, or listening to music. ? Journaling. ? Talking to a trusted person. ? Spending time with friends and family. Safety  Always wear your seat belt while driving or riding in a vehicle.  Do not drive: ? If you have been drinking alcohol. Do not ride with someone who has been drinking. ? When you are tired or distracted. ? While texting.  Wear a helmet and other protective equipment during sports activities.  If you have firearms  in your house, make sure you follow all gun safety procedures. What's next?  Visit your health care provider once a year for an annual wellness visit.  Ask your health care provider how often you should have your eyes and teeth checked.  Stay up to date on all vaccines. This information is not intended to replace advice given to you by your health care provider. Make sure you discuss any questions you have with your health care provider. Document Revised: 03/22/2020 Document Reviewed: 02/27/2018 Elsevier Patient Education  2021 [employer].

## 2020-08-05 ENCOUNTER — Other Ambulatory Visit: Payer: BC Managed Care – PPO

## 2020-08-05 LAB — HEPATITIS C ANTIBODY
Hepatitis C Ab: NONREACTIVE
SIGNAL TO CUT-OFF: 0 (ref <1.00)

## 2020-08-08 ENCOUNTER — Other Ambulatory Visit: Payer: Self-pay | Admitting: Family Medicine

## 2020-08-08 DIAGNOSIS — Z1231 Encounter for screening mammogram for malignant neoplasm of breast: Secondary | ICD-10-CM

## 2020-08-18 ENCOUNTER — Encounter: Payer: Self-pay | Admitting: Gastroenterology

## 2020-08-25 ENCOUNTER — Ambulatory Visit (INDEPENDENT_AMBULATORY_CARE_PROVIDER_SITE_OTHER): Payer: BC Managed Care – PPO

## 2020-08-25 ENCOUNTER — Other Ambulatory Visit: Payer: Self-pay

## 2020-08-25 DIAGNOSIS — B351 Tinea unguium: Secondary | ICD-10-CM

## 2020-08-25 NOTE — Progress Notes (Signed)
Patient presents today for the 4th laser treatment. Diagnosed with mycotic nail infection by Dr. Jacqualyn Posey.   Toenail most affected is the hallux left. New growth is starting to show.  All other systems are negative.  Nails were filed thin. Laser therapy was administered to 1st toenails left and patient tolerated the treatment well. All safety precautions were in place.   She is also using a topical antifungal compound once a day.  Follow up in 6 weeks for laser # 5.

## 2020-08-26 ENCOUNTER — Other Ambulatory Visit: Payer: BC Managed Care – PPO

## 2020-09-06 ENCOUNTER — Other Ambulatory Visit: Payer: Self-pay

## 2020-09-06 ENCOUNTER — Ambulatory Visit
Admission: RE | Admit: 2020-09-06 | Discharge: 2020-09-06 | Disposition: A | Discharge status: Home / Self Care | Payer: BC Managed Care – PPO | Source: Ambulatory Visit

## 2020-09-06 DIAGNOSIS — Z1231 Encounter for screening mammogram for malignant neoplasm of breast: Secondary | ICD-10-CM | POA: Diagnosis not present

## 2020-09-22 ENCOUNTER — Ambulatory Visit (AMBULATORY_SURGERY_CENTER): Payer: Self-pay

## 2020-09-22 ENCOUNTER — Telehealth: Payer: Self-pay | Admitting: *Deleted

## 2020-09-22 ENCOUNTER — Other Ambulatory Visit: Payer: Self-pay

## 2020-09-22 ENCOUNTER — Other Ambulatory Visit: Payer: Self-pay | Admitting: Gastroenterology

## 2020-09-22 VITALS — Ht 68.0 in | Wt 234.0 lb

## 2020-09-22 DIAGNOSIS — Z1211 Encounter for screening for malignant neoplasm of colon: Secondary | ICD-10-CM

## 2020-09-22 MED ORDER — PLENVU 140 G PO SOLR: 1.0000 | ORAL | 0 refills | Status: DC

## 2020-09-22 MED ORDER — PLENVU 140 G PO SOLR: 1.0000 | Freq: Once | ORAL | 0 refills | Status: AC

## 2020-09-22 NOTE — Telephone Encounter (Signed)
Phoned pt.  [facility] not have Pl[facility].  Pt agreeable to try another pharmacy.  Sent Rx into [facility] on [address] and [address].  Pt knows to call us back if they do not have plenvu in stock.

## 2020-09-22 NOTE — Telephone Encounter (Signed)
Phoned pt.  Sent Rx into [facility] on [address] and [address].  Pt knows to call us back if they do not have plenvu in stock.

## 2020-09-22 NOTE — Progress Notes (Signed)
No egg or soy allergy known to patient  No issues with past sedation with any surgeries or procedures Patient denies ever being told they had issues or difficulty with intubation  No FH of Malignant Hyperthermia No diet pills per patient No home 02 use per patient  No blood thinners per patient  Pt denies issues with constipation  No A fib or A flutter  EMMI video via MyChart  COVID 19 guidelines implemented in PV today with Pt and RN  Pt is fully vaccinated for Covid x 2 + booster;  Coupon given to pt in PV today, Code to Pharmacy and  NO PA's for preps discussed with pt in PV today  Discussed with pt there will be an out-of-pocket cost for prep and that varies from $0 to 70 dollars  Due to the COVID-19 pandemic we are asking patients to follow certain guidelines.   Pt aware of COVID protocols and [facility] guidelines  Instructions also sent to MyChart per patient request;

## 2020-09-23 ENCOUNTER — Ambulatory Visit (INDEPENDENT_AMBULATORY_CARE_PROVIDER_SITE_OTHER): Payer: BC Managed Care – PPO | Admitting: *Deleted

## 2020-09-23 DIAGNOSIS — B351 Tinea unguium: Secondary | ICD-10-CM

## 2020-09-23 NOTE — Progress Notes (Signed)
Patient presents today for the 5th laser treatment. Diagnosed with mycotic nail infection by Dr. Jacqualyn Posey.   Toenail most affected is the hallux left. New growth is starting to show.  All other systems are negative.  Nails were filed thin. Laser therapy was administered to 1st toenails left and patient tolerated the treatment well. All safety precautions were in place.   She is also using a topical antifungal compound once a day.  Follow up in 8 weeks for laser # 6

## 2020-10-04 ENCOUNTER — Encounter: Payer: Self-pay | Admitting: Gastroenterology

## 2020-10-04 IMAGING — MG DIGITAL SCREENING BILATERAL MAMMOGRAM WITH TOMO AND CAD
8 series · 8 of 24 positions shown · non-contrast
Comparison: Previous exam(s).

CLINICAL DATA: Screening.

EXAM:
DIGITAL SCREENING BILATERAL MAMMOGRAM WITH TOMO AND CAD

[L CC synth-2D]
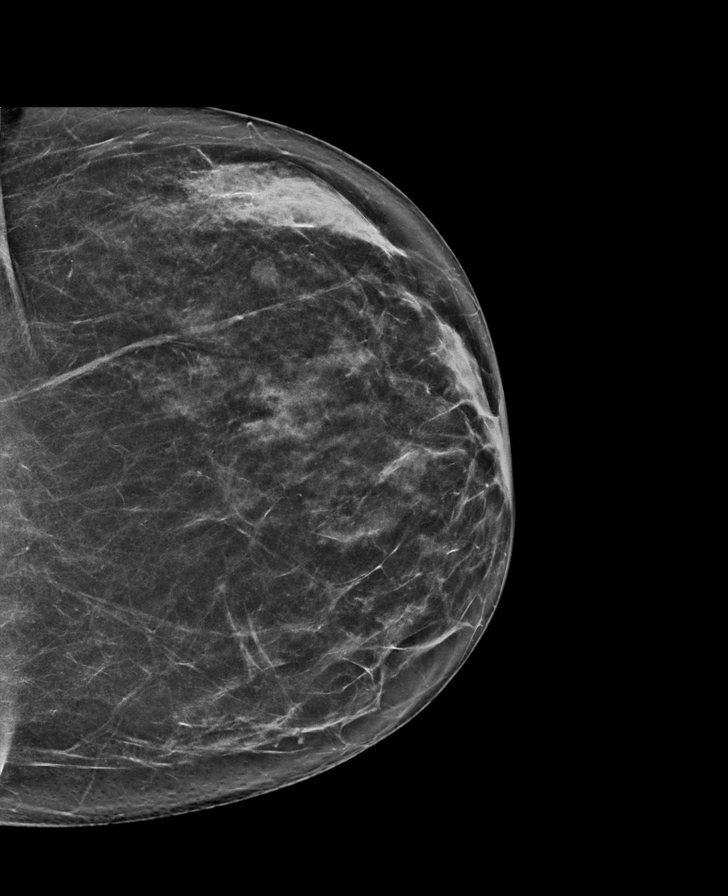

[R MLO synth-2D]
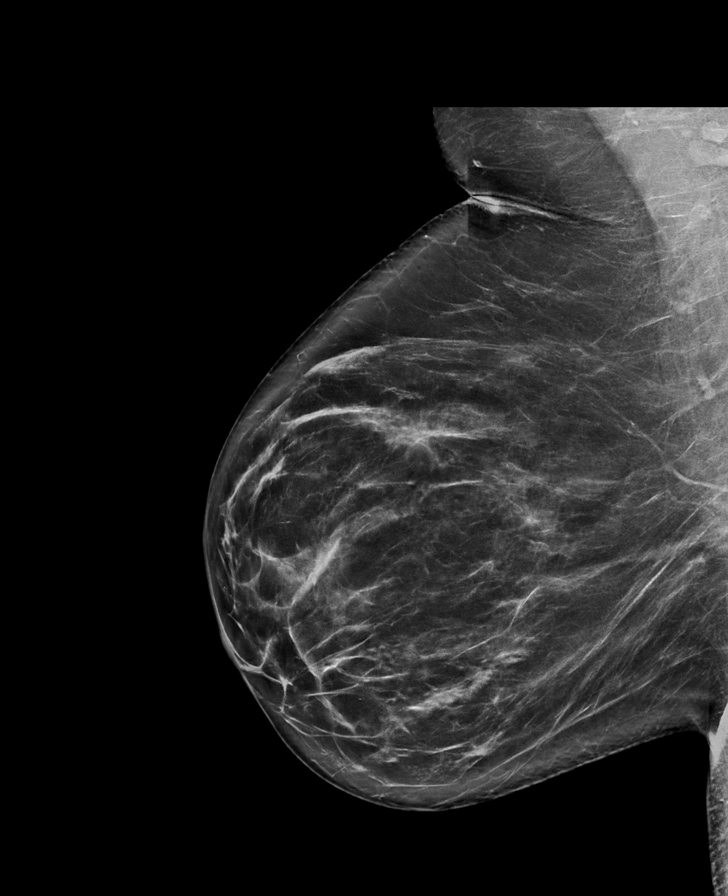

[R CC synth-2D]
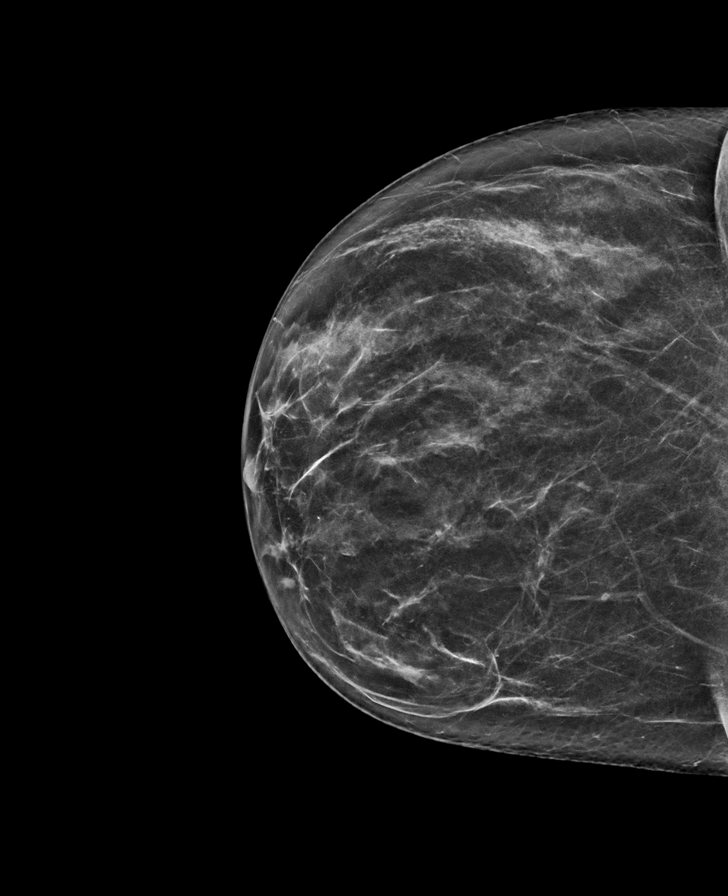

[L MLO synth-2D]
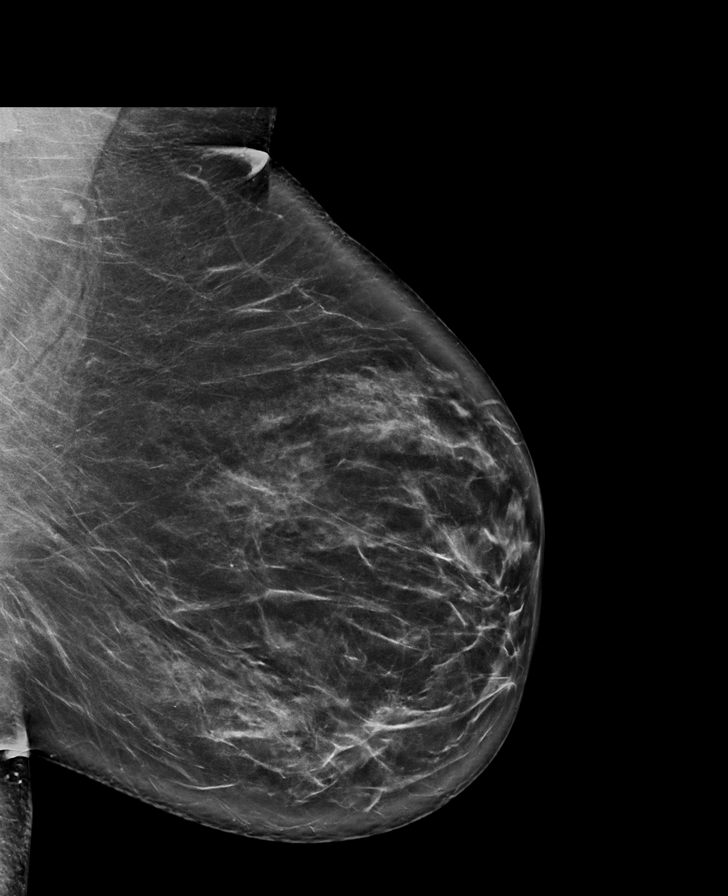

[R CC tomo · tomo slice 37/73.0]
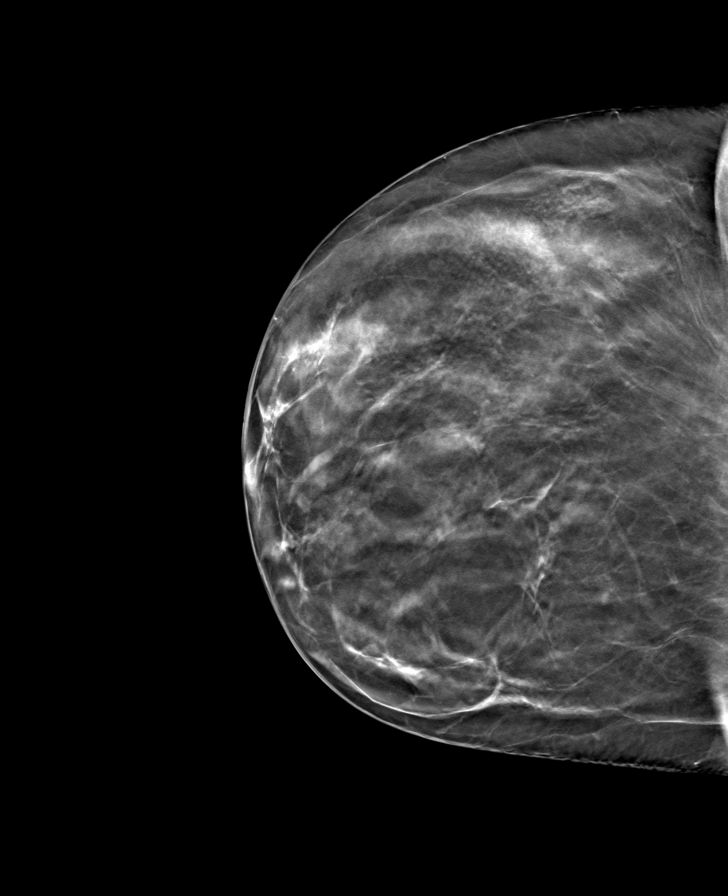

[L MLO tomo · tomo slice 45/89.0]
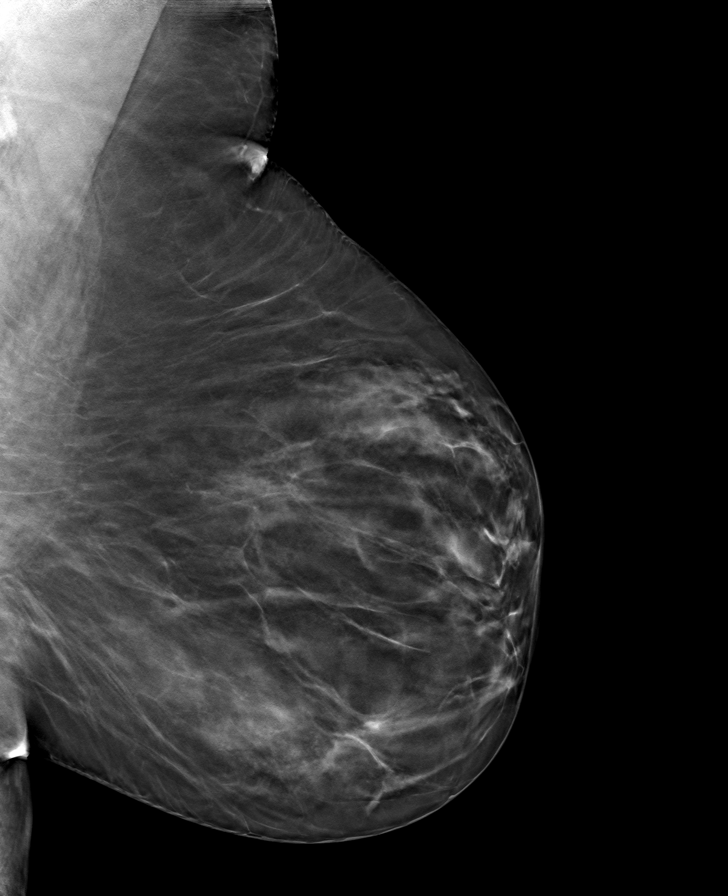

[L CC tomo · tomo slice 38/75.0]
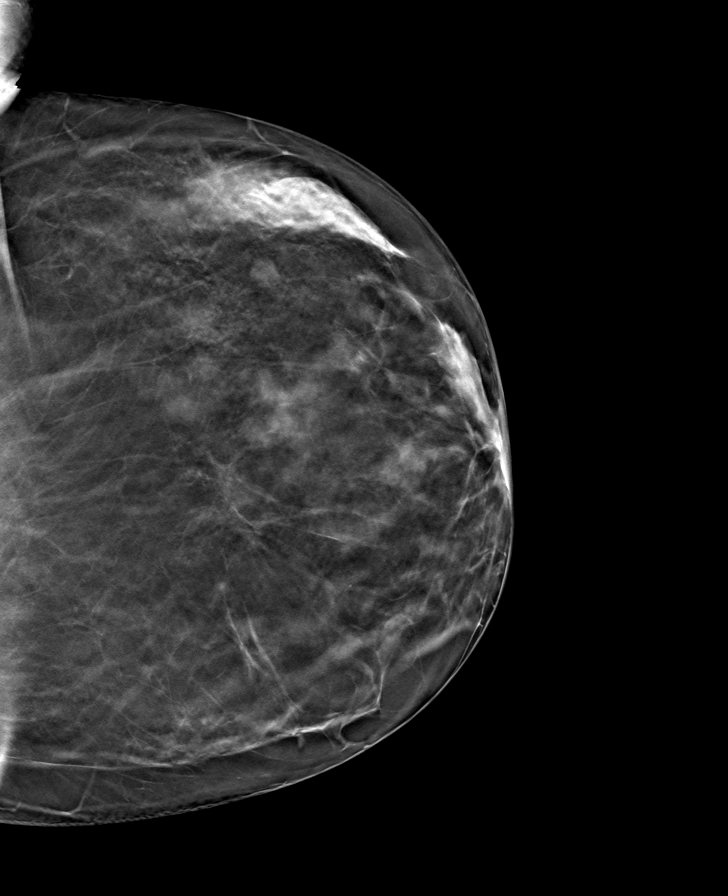

[R MLO tomo · tomo slice 45/89.0]
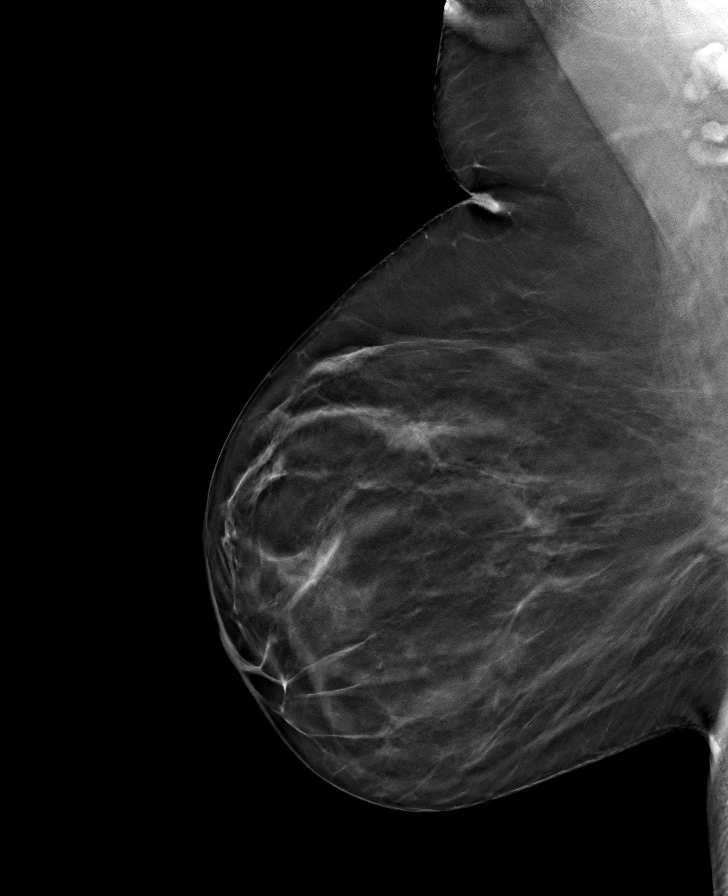

[8 of 24 positions shown; findings below may reference images not displayed]

ACR Breast Density Category b: There are scattered areas of
fibroglandular density.
FINDINGS: There are no findings suspicious for malignancy. Images were
processed with CAD.
IMPRESSION: No mammographic evidence of malignancy. A result letter of this
screening mammogram will be mailed directly to the patient.

RECOMMENDATION:
Screening mammogram in one year. (Code:[id number])

BI-RADS CATEGORY  1: Negative.

## 2020-10-05 ENCOUNTER — Ambulatory Visit (AMBULATORY_SURGERY_CENTER): Payer: BC Managed Care – PPO | Admitting: Gastroenterology

## 2020-10-05 ENCOUNTER — Encounter: Payer: Self-pay | Admitting: Gastroenterology

## 2020-10-05 ENCOUNTER — Other Ambulatory Visit: Payer: Self-pay

## 2020-10-05 VITALS — BP 106/54 | HR 65 | Temp 95.9°F | Resp 17 | Ht 68.0 in | Wt 234.0 lb

## 2020-10-05 DIAGNOSIS — K5140 Inflammatory polyps of colon without complications: Secondary | ICD-10-CM | POA: Diagnosis not present

## 2020-10-05 DIAGNOSIS — D123 Benign neoplasm of transverse colon: Secondary | ICD-10-CM

## 2020-10-05 DIAGNOSIS — Z1211 Encounter for screening for malignant neoplasm of colon: Secondary | ICD-10-CM

## 2020-10-05 DIAGNOSIS — K4140 Unilateral femoral hernia, with gangrene, not specified as recurrent: Secondary | ICD-10-CM | POA: Diagnosis not present

## 2020-10-05 MED ORDER — SODIUM CHLORIDE 0.9 % IV SOLN: 500.0000 mL | Freq: Once | INTRAVENOUS | Status: DC

## 2020-10-05 NOTE — Progress Notes (Signed)
Report to PACU, RN, vss, BBS= Clear.  

## 2020-10-05 NOTE — Progress Notes (Signed)
Called to room to assist during endoscopic procedure.  Patient ID and intended procedure confirmed with present staff. Received instructions for my participation in the procedure from the performing physician.  

## 2020-10-05 NOTE — Patient Instructions (Signed)
YOU HAD AN ENDOSCOPIC PROCEDURE TODAY AT THE [facility]:   Refer to the procedure report that was given to you for any specific questions about what was found during the examination.  If the procedure report does not answer your questions, please call your gastroenterologist to clarify.  If you requested that your care partner not be given the details of your procedure findings, then the procedure report has been included in a sealed envelope for you to review at your convenience later.  YOU SHOULD EXPECT: Some feelings of bloating in the abdomen. Passage of more gas than usual.  Walking can help get rid of the air that was put into your GI tract during the procedure and reduce the bloating. If you had a lower endoscopy (such as a colonoscopy or flexible sigmoidoscopy) you may notice spotting of blood in your stool or on the toilet paper. If you underwent a bowel prep for your procedure, you may not have a normal bowel movement for a few days.  Please Note:  You might notice some irritation and congestion in your nose or some drainage.  This is from the oxygen used during your procedure.  There is no need for concern and it should clear up in a day or so.  SYMPTOMS TO REPORT IMMEDIATELY:  Following lower endoscopy (colonoscopy or flexible sigmoidoscopy):  Excessive amounts of blood in the stool  Significant tenderness or worsening of abdominal pains  Swelling of the abdomen that is new, acute  Fever of 100F or higher    For urgent or emergent issues, a gastroenterologist can be reached at any hour by calling [phone]. Do not use MyChart messaging for urgent concerns.    DIET:  We do recommend a small meal at first, but then you may proceed to your regular diet.  Drink plenty of fluids but you should avoid alcoholic beverages for 24 hours.  ACTIVITY:  You should plan to take it easy for the rest of today and you should NOT DRIVE or use heavy machinery until tomorrow (because  of the sedation medicines used during the test).    FOLLOW UP: Our staff will call the number listed on your records 48-72 hours following your procedure to check on you and address any questions or concerns that you may have regarding the information given to you following your procedure. If we do not reach you, we will leave a message.  We will attempt to reach you two times.  During this call, we will ask if you have developed any symptoms of COVID 19. If you develop any symptoms (ie: fever, flu-like symptoms, shortness of breath, cough etc.) before then, please call [phone].  If you test positive for Covid 19 in the 2 weeks post procedure, please call and report this information to us.    If any biopsies were taken you will be contacted by phone or by letter within the next 1-3 weeks.  Please call us at [phone] if you have not heard about the biopsies in 3 weeks.    SIGNATURES/CONFIDENTIALITY: You and/or your care partner have signed paperwork which will be entered into your electronic medical record.  These signatures attest to the fact that that the information above on your After Visit Summary has been reviewed and is understood.  Full responsibility of the confidentiality of this discharge information lies with you and/or your care-partner.    Resume medications. Information given on polyps. 

## 2020-10-05 NOTE — Op Note (Signed)
[place] Patient Name: [name] Procedure Date: 10/05/2020 9:35 AM MRN: [id number] Endoscopist: Remo Lipps P. Havery [name] , MD Age: 50 Referring MD:  Date of Birth: [birth year] Gender: Female Account #: [id number] Procedure:                Colonoscopy Indications:              Screening for colorectal malignant neoplasm, This                            is the patient's first colonoscopy Medicines:                Monitored Anesthesia Care Procedure:                Pre-Anesthesia Assessment:                           - Prior to the procedure, a History and Physical                            was performed, and patient medications and                            allergies were reviewed. The patient's tolerance of                            previous anesthesia was also reviewed. The risks                            and benefits of the procedure and the sedation                            options and risks were discussed with the patient.                            All questions were answered, and informed consent                            was obtained. Prior Anticoagulants: The patient has                            taken no previous anticoagulant or antiplatelet                            agents. ASA Grade Assessment: II - A patient with                            mild systemic disease. After reviewing the risks                            and benefits, the patient was deemed in                            satisfactory condition to undergo the procedure.  After obtaining informed consent, the colonoscope                            was passed under direct vision. Throughout the                            procedure, the patient's blood pressure, pulse, and                            oxygen saturations were monitored continuously. The                            Olympus CF-HQ190L [id number]) Colonoscope was                            introduced through the  anus and advanced to the the                            terminal ileum, with identification of the                            appendiceal orifice and IC valve. The colonoscopy                            was performed without difficulty. The patient                            tolerated the procedure well. The quality of the                            bowel preparation was good. The terminal ileum,                            ileocecal valve, appendiceal orifice, and rectum                            were photographed. Scope In: 9:48:02 AM Scope Out: 10:02:59 AM Scope Withdrawal Time: 0 hours 12 minutes 20 seconds  Total Procedure Duration: 0 hours 14 minutes 57 seconds  Findings:                 The perianal and digital rectal examinations were                            normal.                           The terminal ileum appeared normal.                           A 3 to 4 mm polyp was found in the transverse                            colon. The polyp was sessile. The polyp was removed  with a cold snare. Resection and retrieval were                            complete.                           The exam was otherwise without abnormality. Complications:            No immediate complications. Estimated blood loss:                            Minimal. Estimated Blood Loss:     Estimated blood loss was minimal. Impression:               - The examined portion of the ileum was normal.                           - One 3 to 4 mm polyp in the transverse colon,                            removed with a cold snare. Resected and retrieved.                           - The examination was otherwise normal. Recommendation:           - Patient has a contact number available for                            emergencies. The signs and symptoms of potential                            delayed complications were discussed with the                            patient. Return to normal  activities tomorrow.                            Written discharge instructions were provided to the                            patient.                           - Resume previous diet.                           - Continue present medications.                           - Await pathology results. Remo Lipps P. Cash Duce, MD 10/05/2020 10:06:05 AM This report has been signed electronically.

## 2020-10-05 NOTE — Progress Notes (Signed)
Vs by N Campbell,LPN in adm  Pt's states no medical or surgical changes since previsit or office visit.

## 2020-10-07 ENCOUNTER — Telehealth: Payer: Self-pay | Admitting: *Deleted

## 2020-10-07 NOTE — Telephone Encounter (Signed)
  Follow up Call-  Call back number 10/05/2020  Post procedure Call Back phone  # [phone]  Permission to leave phone message Yes  Some recent data might be hidden     Patient questions:  Do you have a fever, pain , or abdominal swelling? No. Pain Score  0 *  Have you tolerated food without any problems? Yes.    Have you been able to return to your normal activities? Yes.    Do you have any questions about your discharge instructions: Diet   No. Medications  No. Follow up visit  No.  Do you have questions or concerns about your Care? No.  Actions: * If pain score is 4 or above: 1. No action needed, pain <4.Have you developed a fever since your procedure? no  2.   Have you had an respiratory symptoms (SOB or cough) since your procedure? no  3.   Have you tested positive for COVID 19 since your procedure no  4.   Have you had any family members/close contacts diagnosed with the COVID 19 since your procedure?  no   If yes to any of these questions please route to Joylene John, RN and Joella Prince, RN

## 2020-10-12 ENCOUNTER — Encounter: Payer: Self-pay | Admitting: Family Medicine

## 2020-10-12 ENCOUNTER — Other Ambulatory Visit: Payer: Self-pay

## 2020-10-12 ENCOUNTER — Ambulatory Visit: Payer: BC Managed Care – PPO | Admitting: Family Medicine

## 2020-10-12 VITALS — BP 110/64 | HR 63 | Temp 97.7°F | Wt 234.0 lb

## 2020-10-12 DIAGNOSIS — K5140 Inflammatory polyps of colon without complications: Secondary | ICD-10-CM

## 2020-10-12 DIAGNOSIS — L309 Dermatitis, unspecified: Secondary | ICD-10-CM | POA: Diagnosis not present

## 2020-10-12 DIAGNOSIS — K635 Polyp of colon: Secondary | ICD-10-CM | POA: Insufficient documentation

## 2020-10-12 HISTORY — DX: Inflammatory polyps of colon without complications: K51.40

## 2020-10-12 MED ORDER — KETOCONAZOLE 2 % EX CREA: 1.0000 "application " | TOPICAL_CREAM | Freq: Two times a day (BID) | CUTANEOUS | 0 refills | Status: AC

## 2020-10-12 NOTE — Progress Notes (Signed)
Subjective  CC:  Chief Complaint  Patient presents with  . Rash    Right side of abdomen, extremely itchy. Denies any recent insect bites     HPI: [name] is a 50 y.o. female who presents to the office today to address the problems listed above in the chief complaint.  Small red.  Right lower abdomen x3 weeks.  No change in color or size.  Not spreading, however very itchy.  No warmth or drainage.  No systemic symptoms.  No history of eczema but her daughter does have eczema.  Otherwise feels fine.  Has not used any topicals routinely, use over-the-counter Lotrimin x2 days without any change in symptoms.  Had colonoscopy last week: Reviewed pathology.  One inflammatory polyp.  No dysplasia.  She reports the prep was difficult but tolerated procedure well without complications. Assessment  1. Eczema, unspecified type   2. Pseudopolyposis of colon without complication, unspecified part of colon ([place])      Plan   Likely eczema, possible fungal: Discussed likely etiologies.  Daughter has steroid cream at home that she received from dermatologist yesterday.  Recommend using that, she thinks it is triamcinolone.  Use twice daily for the next 2 weeks.  If not proving, may use ketoconazole.  Follow-up if needed.  Benign colon polyp: Reviewed.  Suspect follow-up in 10 years.  Await GI results.  Follow up: As needed Visit date not found  No orders of the defined types were placed in this encounter.  Meds ordered this encounter  Medications  . ketoconazole (NIZORAL) 2 % cream    Sig: Apply 1 application topically 2 (two) times daily for 7 days. Then as needed    Dispense:  30 g    Refill:  0      I reviewed the patients updated PMH, FH, and SocHx.    Patient Active Problem List   Diagnosis Date Noted  . Family history of early CAD 03/17/2020    Priority: High  . Moderate mixed hyperlipidemia not requiring statin therapy 03/17/2020    Priority: High  . Chronic  idiopathic pericarditis 05/13/2017    Priority: High  . Obesity (BMI 30-39.9) 05/13/2017    Priority: High  . Colon polyp 10/12/2020    Priority: Medium  . Breast fibroadenoma, left 08/12/2019    Priority: Medium  . Oral contraceptive pill surveillance 08/04/2019    Priority: Medium  . PSVT (paroxysmal supraventricular tachycardia) ([place]) 05/13/2017    Priority: Medium   Current Meds  Medication Sig  . diltiazem (CARDIZEM CD) 240 MG 24 hr capsule Take 1 capsule (240 mg total) by mouth daily.  Marland Kitchen diltiazem (CARDIZEM) 30 MG tablet Take one tablet by mouth every 6 hours as needed for palpitations.  Marland Kitchen ketoconazole (NIZORAL) 2 % cream Apply 1 application topically 2 (two) times daily for 7 days. Then as needed  . NONFORMULARY OR COMPOUNDED ITEM Antifungal solution: Terbinafine 3%, Fluconazole 2%, Tea Tree Oil 5%, Urea 10%, Ibuprofen 2% in DMSO suspension #16mL  . predniSONE (DELTASONE) 10 MG tablet Take 10 mg by mouth as needed (idiopathic pericarditis). Take as needed.  . SPRINTEC 28 0.25-35 MG-MCG tablet TAKE 1 TABLET BY MOUTH EVERY DAY    Allergies: Patient has No Known Allergies. Family History: Patient family history includes Arthritis in her mother; Diabetes in her father; Early death in her father; Heart attack (age of onset: 9) in her father; Heart disease in her father; High Cholesterol in her father; Lung cancer in  her paternal grandmother; Skin cancer in her maternal grandfather; Thyroid disease in her mother. Social History:  Patient  reports that she has never smoked. She has never used smokeless tobacco. She reports current alcohol use of about 1.0 standard drink of alcohol per week. She reports that she does not use drugs.  Review of Systems: Constitutional: Negative for fever malaise or anorexia Cardiovascular: negative for chest pain Respiratory: negative for SOB or persistent cough Gastrointestinal: negative for abdominal pain  Objective  Vitals: BP 110/64   Pulse 63    Temp 97.7 F (36.5 C) (Temporal)   Wt 234 lb (106.1 kg)   LMP 09/28/2020 Comment: no chance of pregnancy per pt  SpO2 98%   BMI 35.58 kg/m  General: no acute distress , A&Ox3 Skin:  Warm, right lower abdomen with approximately 1 cm annular macular rash, some flaking.  No satellite lesions.  No raised borders.  No vesicles     Commons side effects, risks, benefits, and alternatives for medications and treatment plan prescribed today were discussed, and the patient expressed understanding of the given instructions. Patient is instructed to call or message via MyChart if he/she has any questions or concerns regarding our treatment plan. No barriers to understanding were identified. We discussed Red Flag symptoms and signs in detail. Patient expressed understanding regarding what to do in case of urgent or emergency type symptoms.   Medication list was reconciled, printed and provided to the patient in AVS. Patient instructions and summary information was reviewed with the patient as documented in the AVS. This note was prepared with assistance of Dragon voice recognition software. Occasional wrong-word or sound-a-like substitutions may have occurred due to the inherent limitations of voice recognition software  This visit occurred during the SARS-CoV-2 public health emergency.  Safety protocols were in place, including screening questions prior to the visit, additional usage of staff PPE, and extensive cleaning of exam room while observing appropriate contact time as indicated for disinfecting solutions.

## 2020-10-12 NOTE — Patient Instructions (Signed)
Please follow up if symptoms do not improve or as needed.   Use the ketoconazole cream if the steroid cream does not resolve the problem.

## 2020-11-18 ENCOUNTER — Other Ambulatory Visit: Payer: BC Managed Care – PPO

## 2020-11-21 ENCOUNTER — Ambulatory Visit (INDEPENDENT_AMBULATORY_CARE_PROVIDER_SITE_OTHER): Payer: BC Managed Care – PPO

## 2020-11-21 ENCOUNTER — Other Ambulatory Visit: Payer: Self-pay

## 2020-11-21 DIAGNOSIS — B351 Tinea unguium: Secondary | ICD-10-CM

## 2020-11-21 NOTE — Patient Instructions (Signed)

## 2020-11-21 NOTE — Progress Notes (Signed)
Patient presents today for the 6th laser treatment. Diagnosed with mycotic nail infection by Dr. Jacqualyn Posey.   Toenail most affected is the hallux left. New growth is starting to show.  All other systems are negative.  Nails were filed thin. Laser therapy was administered to 1st toenails left and patient tolerated the treatment well. All safety precautions were in place.   She is also using a topical antifungal compound once a day.  Patient dropped her cell phone on her left hallux a few days ago and the nailbed is bruised.   Patient has completed the recommended laser treatments. He will follow up with Dr. Jacqualyn Posey in 3 months to evaluate progress.

## 2021-02-21 ENCOUNTER — Encounter: Payer: Self-pay | Admitting: Podiatry

## 2021-02-21 ENCOUNTER — Ambulatory Visit: Payer: BC Managed Care – PPO | Admitting: Podiatry

## 2021-02-21 ENCOUNTER — Other Ambulatory Visit: Payer: Self-pay

## 2021-02-21 DIAGNOSIS — B351 Tinea unguium: Secondary | ICD-10-CM | POA: Diagnosis not present

## 2021-02-21 NOTE — Progress Notes (Signed)
Subjective: 50 year old female presents the office today for evaluation of left big toenail fungus as well as trauma.  She states that she was doing laser treatment and thought that this was very helpful and the fungus is improving.  Before her last laser treatment she dropped her cell phone on the toe and cause italic become purple.  She said the nail came off on its own last week.  No swelling or redness or any drainage.  She wants to make sure that the fungus has not present any longer.  Objective: AAO x3, NAD DP/PT pulses palpable bilaterally, CRT less than 3 seconds There appears to be new nail growth on the proximal nail fold left hallux toe.  The nail itself appears to be clear.  There is no significant dystrophy or hypertrophy of the nail and the nail appears to be of normal color.  There is no edema, erythema or any signs of infection.  The right hallux toenail somewhat dystrophic and hypertrophic.  No pain.  No open lesions. No pain with calf compression, swelling, warmth, erythema  Assessment: Resolving onychomycosis  Plan: At this point appears that the fungus of the left hallux toenail is much improved.  We will have to watch the nails a new nail comes in given the trauma.  Recommended a biotin supplement she can use vitamin E on the nail as well.  Monitor for any thickening or reoccurrence of fungus/damage of the nail comes in.  Trula Slade DPM

## 2021-04-13 ENCOUNTER — Ambulatory Visit: Payer: BC Managed Care – PPO | Admitting: Cardiology

## 2021-04-13 ENCOUNTER — Encounter: Payer: Self-pay | Admitting: Cardiology

## 2021-04-13 ENCOUNTER — Other Ambulatory Visit: Payer: Self-pay

## 2021-04-13 VITALS — BP 118/64 | HR 71 | Ht 68.0 in | Wt 244.6 lb

## 2021-04-13 DIAGNOSIS — I471 Supraventricular tachycardia: Secondary | ICD-10-CM

## 2021-04-13 DIAGNOSIS — Z8249 Family history of ischemic heart disease and other diseases of the circulatory system: Secondary | ICD-10-CM | POA: Diagnosis not present

## 2021-04-13 NOTE — Progress Notes (Signed)
Electrophysiology Office Note   Date:  04/13/2021   ID:  [name], DOB 1971-02-04, MRN [id number]  PCP:  Leamon Arnt, MD  Cardiologist:  Angelena Form Primary Electrophysiologist:  Otha Rickles Meredith Leeds, MD    No chief complaint on file.    History of Present Illness: [name] is a 50 y.o. female who is being seen today for the evaluation of [name] at the request of Darlina Guys. She is a Pharmacist, hospital at [employer]. Presenting today for electrophysiology evaluation.    She has a history significant pericarditis status post pericardial window.  She had previously been seen by general cardiology for [name].  She has had recurrent idiopathic pericarditis in the past in [place].  She was diagnosed in 2008.  At the time, she had a pericard[facility] window [facility].  She was treated with methotrexate in the past.  She also has [name].  She was living in [place] when she noted her episodes of [name].  She previously had been on as needed diltiazem, though she had had more frequent episodes of [name] after having COVID October 2020.  She was started on long-acting diltiazem.  Today, denies symptoms of palpitations, chest pain, shortness of breath, orthopnea, PND, lower extremity edema, claudication, dizziness, presyncope, syncope, bleeding, or neurologic sequela. The patient is tolerating medications without difficulties.  Overall she is doing well.  She has not had any further prolonged episodes of [name].  Prior to starting the, her episodes were lasting up to 10 minutes.  Now her episodes are lasting only a few seconds.  This past spring, she did have an episode that lasted a little bit longer, but overall she has been well controlled and is happy with her control.  She is concerned about her coronary risk.  Her father had an MI at age 36.  She has no symptoms of chest pain or shortness of breath, but she would like to be risk stratified.  Past Medical History:  Diagnosis Date   Breast fibroadenoma, left  08/12/2019   biospy   Chronic idiopathic pericarditis 05/13/2017   Family history of early CAD 03-26-2020   Father died age 35   HLD (hyperlipidemia) 09/16/2017   diet controlled   Inflammatory polyps of colon ([place]) 10/12/2020   Colonoscopy 09/2020, Dr. Havery [name]   Obesity (BMI 30-39.9) 05/13/2017   Onychomycosis 07/29/2018   PSVT (paroxysmal supraventricular tachycardia) ([place]) 05/13/2017   dx 2018   Past Surgical History:  Procedure Laterality Date   BREAST BIOPSY Left 08/11/2019   CESAREAN SECTION     PERICARDIAL WINDOW     WISDOM TOOTH EXTRACTION       Current Outpatient Medications  Medication Sig Dispense Refill   diltiazem (CARDIZEM CD) 240 MG 24 hr capsule Take 1 capsule (240 mg total) by mouth daily. 90 capsule 3   diltiazem (CARDIZEM) 30 MG tablet Take one tablet by mouth every 6 hours as needed for palpitations. 30 tablet 3   SPRINTEC 28 0.25-35 MG-MCG tablet TAKE 1 TABLET BY MOUTH EVERY DAY 84 tablet 3   predniSONE (DELTASONE) 10 MG tablet Take 10 mg by mouth as needed (idiopathic pericarditis). Take as needed. (Patient not taking: Reported on 04/13/2021)     No current facility-administered medications for this visit.    Allergies:   Patient has no known allergies.   Social History:  The patient  reports that she has never smoked. She has never used smokeless tobacco. She reports current alcohol use of about 1.0 standard drink per  week. She reports that she does not use drugs.   Family History:  The patient's family history includes Arthritis in her mother; Diabetes in her father; Early death in her father; Heart attack (age of onset: 3) in her father; Heart disease in her father; High Cholesterol in her father; Lung cancer in her paternal grandmother; Skin cancer in her maternal grandfather; Thyroid disease in her mother.   ROS:  Please see the history of present illness.   Otherwise, review of systems is positive for none.   All other systems are reviewed and  negative.   PHYSICAL EXAM: VS:  BP 118/64   Pulse 71   Ht 5\' 8"  (1.727 m)   Wt 244 lb 9.6 oz (110.9 kg)   SpO2 97%   BMI 37.19 kg/m  , BMI Body mass index is 37.19 kg/m. GEN: Well nourished, well developed, in no acute distress  HEENT: normal  Neck: no JVD, carotid bruits, or masses Cardiac: RRR; no murmurs, rubs, or gallops,no edema  Respiratory:  clear to auscultation bilaterally, normal work of breathing GI: soft, nontender, nondistended, + BS MS: no deformity or atrophy  Skin: warm and dry Neuro:  Strength and sensation are intact Psych: euthymic mood, full affect  EKG:  EKG is ordered today. Personal review of the ekg ordered shows sinus rhythm, rate 71  Recent Labs: 08/04/2020: ALT 8; BUN 13; Creatinine, Ser 0.65; Hemoglobin 13.8; Platelets 263.0; Potassium 3.8; Sodium 137; TSH 0.62    Lipid Panel     Component Value Date/Time   CHOL 228 (H) 08/04/2020 1006   TRIG 122.0 08/04/2020 1006   HDL 59.40 08/04/2020 1006   CHOLHDL 4 08/04/2020 1006   VLDL 24.4 08/04/2020 1006   LDLCALC 144 (H) 08/04/2020 1006   LDLCALC 156 (H) 03/17/2020 1145     Wt Readings from Last 3 Encounters:  04/13/21 244 lb 9.6 oz (110.9 kg)  10/12/20 234 lb (106.1 kg)  10/05/20 234 lb (106.1 kg)      Other studies Reviewed: Additional studies/ records that were reviewed today include: TTE 11/29/17  Review of the above records today demonstrates:  - Left ventricle: The cavity size was normal. Systolic function was   normal. The estimated ejection fraction was in the range of 55%   to 60%. Wall motion was normal; there were no regional wall   motion abnormalities. Left ventricular diastolic function   parameters were normal. - Atrial septum: No defect or patent foramen ovale was identified.  30 day monitor 11/29/17 - personally reviewed Sinus rhythm Premature ventricular contractions Several episodes of supraventricular tachycardia ([name])  ASSESSMENT AND PLAN:  1.  [name]: Likely due to  AVNRT.  He is currently on diltiazem 240 mg daily.  She continues to have short episodes of [name], up to 30 seconds on a daily basis.  She is overall comfortable with her control.  No changes.  2.  Chronic pericarditis: No current chest pain.  Plan per primary cardiology.  3.  Family history of coronary artery disease: Patient is concerned as her father has a had an MI at age 31.  She has no symptoms, but would like to quantify her risk.  Due to that, we Princetta Uplinger get a coronary calcium score.  Current medicines are reviewed at length with the patient today.   The patient does not have concerns regarding her medicines.  The following changes were made today: None  Labs/ tests ordered today include:  Orders Placed This Encounter  Procedures  CT CARDIAC SCORING (SELF PAY ONLY)   EKG 12-Lead     Disposition:   FU with Maytte Jacot 12 months  Signed, Jakwon Gayton Meredith Leeds, MD  04/13/2021 10:24 AM     [facility] [address] [place] [address] [phone] (office) [phone] (fax)

## 2021-04-13 NOTE — Patient Instructions (Signed)
Medication Instructions:  Your physician recommends that you continue on your current medications as directed. Please refer to the Current Medication list given to you today.  *If you need a refill on your cardiac medications before your next appointment, please call your pharmacy*   Lab Work: None ordered   Testing/Procedures: Your physician has requested that you have cardiac CT calcium score testing.   Follow-Up: At [facility], you and your health needs are our priority.  As part of our continuing mission to provide you with exceptional heart care, we have created designated Provider Care Teams.  These Care Teams include your primary Cardiologist (physician) and Advanced Practice Providers (APPs -  Physician Assistants and Nurse Practitioners) who all work together to provide you with the care you need, when you need it.  Your next appointment:   1 year(s)  The format for your next appointment:   In Person  Provider:   Allegra Lai, MD    Thank you for choosing [facility]!!   Trinidad Curet, RN [phone]   Other Instructions   Coronary Calcium Scan A coronary calcium scan is an imaging test used to look for deposits of plaque in the inner lining of the blood vessels of the heart (coronary arteries). Plaque is made up of calcium, protein, and fatty substances. These deposits of plaque can partly clog and narrow the coronary arteries without producing any symptoms or warning signs. This puts a person at risk for a heart attack. This test is recommended for people who are at moderate risk for heart disease. The test can find plaque deposits before symptoms develop. Tell a health care provider about: Any allergies you have. All medicines you are taking, including vitamins, herbs, eye drops, creams, and over-the-counter medicines. Any problems you or family members have had with anesthetic medicines. Any blood disorders you have. Any surgeries you have had. Any  medical conditions you have. Whether you are pregnant or may be pregnant. What are the risks? Generally, this is a safe procedure. However, problems may occur, including: Harm to a pregnant woman and her unborn baby. This test involves the use of radiation. Radiation exposure can be dangerous to a pregnant woman and her unborn baby. If you are pregnant or think you may be pregnant, you should not have this procedure done. Slight increase in the risk of cancer. This is because of the radiation involved in the test. What happens before the procedure? Ask your health care provider for any specific instructions on how to prepare for this procedure. You may be asked to avoid products that contain caffeine, tobacco, or nicotine for 4 hours before the procedure. What happens during the procedure?  You will undress and remove any jewelry from your neck or chest. You will put on a hospital gown. Sticky electrodes will be placed on your chest. The electrodes will be connected to an electrocardiogram (ECG) machine to record a tracing of the electrical activity of your heart. You will lie down on a curved bed that is attached to the [place]. You may be given medicine to slow down your heart rate so that clear pictures can be created. You will be moved into the CT scanner, and the CT scanner will take pictures of your heart. During this time, you will be asked to lie still and hold your breath for 2-3 seconds at a time while each picture of your heart is being taken. The procedure may vary among health care providers and hospitals. What  happens after the procedure? You can get dressed. You can return to your normal activities. It is up to you to get the results of your procedure. Ask your health care provider, or the department that is doing the procedure, when your results will be ready. Summary A coronary calcium scan is an imaging test used to look for deposits of plaque in the inner lining of the  blood vessels of the heart (coronary arteries). Plaque is made up of calcium, protein, and fatty substances. Generally, this is a safe procedure. Tell your health care provider if you are pregnant or may be pregnant. Ask your health care provider for any specific instructions on how to prepare for this procedure. A CT scanner will take pictures of your heart. You can return to your normal activities after the scan is done. This information is not intended to replace advice given to you by your health care provider. Make sure you discuss any questions you have with your health care provider. Document Revised: 01/06/2019 Document Reviewed: 01/06/2019 Elsevier Patient Education  [place].

## 2021-04-17 ENCOUNTER — Other Ambulatory Visit: Payer: Self-pay | Admitting: Physician Assistant

## 2021-04-17 DIAGNOSIS — N926 Irregular menstruation, unspecified: Secondary | ICD-10-CM

## 2021-04-21 ENCOUNTER — Other Ambulatory Visit: Payer: Self-pay

## 2021-04-21 ENCOUNTER — Encounter: Payer: Self-pay | Admitting: Physician Assistant

## 2021-04-21 ENCOUNTER — Ambulatory Visit: Payer: BC Managed Care – PPO | Admitting: Physician Assistant

## 2021-04-21 VITALS — BP 117/81 | HR 61 | Temp 98.2°F | Ht 68.0 in | Wt 235.2 lb

## 2021-04-21 DIAGNOSIS — L239 Allergic contact dermatitis, unspecified cause: Secondary | ICD-10-CM | POA: Diagnosis not present

## 2021-04-21 MED ORDER — METHYLPREDNISOLONE ACETATE 80 MG/ML IJ SUSP
80.0000 mg | Freq: Once | INTRAMUSCULAR | Status: AC
  Administered 2021-04-21: 80 mg via INTRAMUSCULAR

## 2021-04-21 MED ORDER — HYDROXYZINE PAMOATE 25 MG PO CAPS: 25.0000 mg | ORAL_CAPSULE | Freq: Three times a day (TID) | ORAL | 0 refills | Status: DC | PRN

## 2021-04-21 MED ORDER — PREDNISONE 20 MG PO TABS: 20.0000 mg | ORAL_TABLET | Freq: Every day | ORAL | 0 refills | Status: AC

## 2021-04-21 NOTE — Progress Notes (Signed)
Subjective:    Patient ID: [name], female    DOB: 08-25-1970, 50 y.o.   MRN: [id number]  Chief Complaint  Patient presents with   Rash    Rash  Patient is in today for itchy rash x 2 weeks. Worse as of yesterday morning going down both legs. Does not seem to be on her abdomen, back, arms, or neck. No rash in between fingers or toes. No new medications. No new detergents, lotions, soaps, or anything else she can think of. States she was awake most of last night trying not to itch. No family members with rash. No other symptoms.   Past Medical History:  Diagnosis Date   Breast fibroadenoma, left 08/12/2019   biospy   Chronic idiopathic pericarditis 05/13/2017   Family history of early CAD 03/29/2020   Father died age 60   HLD (hyperlipidemia) 09/16/2017   diet controlled   Inflammatory polyps of colon ([place]) 10/12/2020   Colonoscopy 09/2020, Dr. Havery [name]   Obesity (BMI 30-39.9) 05/13/2017   Onychomycosis 07/29/2018   PSVT (paroxysmal supraventricular tachycardia) ([place]) 05/13/2017   dx 2018    Past Surgical History:  Procedure Laterality Date   BREAST BIOPSY Left 08/11/2019   CESAREAN SECTION     PERICARDIAL WINDOW     WISDOM TOOTH EXTRACTION      Family History  Problem Relation Age of Onset   Arthritis Mother    Thyroid disease Mother    Diabetes Father    Early death Father        Cardiogenic shock/CAD/MI   High Cholesterol Father    Heart disease Father    Heart attack Father 32       and again at age 63   Skin cancer Maternal Grandfather    Lung cancer Paternal Grandmother        non-smoker   Colon polyps Neg Hx    Colon cancer Neg Hx    Esophageal cancer Neg Hx    Stomach cancer Neg Hx    Rectal cancer Neg Hx     Social History   Tobacco Use   Smoking status: Never   Smokeless tobacco: Never  Vaping Use   Vaping Use: Never used  Substance Use Topics   Alcohol use: Yes    Alcohol/week: 1.0 standard drink    Types: 1 Glasses of wine per week     Comment: 2x per month   Drug use: No     No Known Allergies  Review of Systems  Skin:  Positive for rash.  REFER TO HPI FOR PERTINENT POSITIVES AND NEGATIVES      Objective:     BP 117/81   Pulse 61   Temp 98.2 F (36.8 C)   Ht 5\' 8"  (1.727 m)   Wt 235 lb 3.2 oz (106.7 kg)   SpO2 99%   BMI 35.76 kg/m   Wt Readings from Last 3 Encounters:  04/21/21 235 lb 3.2 oz (106.7 kg)  04/13/21 244 lb 9.6 oz (110.9 kg)  10/12/20 234 lb (106.1 kg)    BP Readings from Last 3 Encounters:  04/21/21 117/81  04/13/21 118/64  10/12/20 110/64     Physical Exam Vitals and nursing note reviewed.  Constitutional:      Appearance: Normal appearance.  Cardiovascular:     Pulses: Normal pulses.     Heart sounds: Normal heart sounds.  Pulmonary:     Effort: Pulmonary effort is normal.     Breath sounds: Normal breath sounds.  Skin:    Comments: Both legs covered in diffuse raised tiny papules c/w dermatitis. No signs of infection, shingles, scabies, or viral exanthem.  Neurological:     Mental Status: She is alert.  Psychiatric:        Mood and Affect: Mood normal.        Behavior: Behavior normal.       Assessment & Plan:   Problem List Items Addressed This Visit   None Visit Diagnoses     Allergic contact dermatitis, unspecified trigger    -  Primary   Relevant Medications   methylPREDNISolone acetate (DEPO-MEDROL) injection 80 mg (Completed) (Start on 04/21/2021  8:00 AM)        Meds ordered this encounter  Medications   methylPREDNISolone acetate (DEPO-MEDROL) injection 80 mg   hydrOXYzine (VISTARIL) 25 MG capsule    Sig: Take 1 capsule (25 mg total) by mouth every 8 (eight) hours as needed.    Dispense:  30 capsule    Refill:  0   predniSONE (DELTASONE) 20 MG tablet    Sig: Take 1 tablet (20 mg total) by mouth daily with breakfast for 5 days.    Dispense:  5 tablet    Refill:  0    1. Allergic contact dermatitis, unspecified trigger -Unsure of cause  and why it's only on her legs at this time -Depo 80 mg IM in office today -Further instructions provided in AVS for her -She knows this may take a few weeks to resolve completely   Armin Yerger M Serjio Deupree, PA-C

## 2021-04-21 NOTE — Patient Instructions (Signed)
This looks like an allergic contact dermatitis. It may take a few weeks to completely resolve.  Depomedrol 80 mg IM given in office today. Give this a few days to take effect. IF you are noticing spreading of the rash, then start on the oral prednisone I prescribed.  Hydroxyzine up to 3 times daily for itching. Caution drowsiness.  Do NOT use Benadryl Cream. Oatmeal bath or calamine lotion is OK.  You may also use loratadine 10 mg and pepcid to help with antihistamine response.

## 2021-04-26 ENCOUNTER — Other Ambulatory Visit: Payer: Self-pay | Admitting: Cardiology

## 2021-05-01 ENCOUNTER — Ambulatory Visit: Payer: BC Managed Care – PPO | Admitting: Family Medicine

## 2021-05-01 ENCOUNTER — Other Ambulatory Visit: Payer: Self-pay

## 2021-05-01 ENCOUNTER — Encounter: Payer: Self-pay | Admitting: Family Medicine

## 2021-05-01 VITALS — BP 116/78 | HR 68 | Temp 97.8°F | Ht 68.0 in | Wt 245.6 lb

## 2021-05-01 DIAGNOSIS — B86 Scabies: Secondary | ICD-10-CM

## 2021-05-01 DIAGNOSIS — N6311 Unspecified lump in the right breast, upper outer quadrant: Secondary | ICD-10-CM

## 2021-05-01 MED ORDER — PERMETHRIN 5 % EX CREA: 1.0000 "application " | TOPICAL_CREAM | Freq: Once | CUTANEOUS | 1 refills | Status: AC

## 2021-05-01 NOTE — Progress Notes (Signed)
Subjective  CC:  Chief Complaint  Patient presents with   Breast Pain    Right side, intermittent for months    Rash    Has some new areas, was seen for this before    HPI: [name] is a 50 y.o. female who presents to the office today to address the problems listed above in the chief complaint. 50 year old female with normal mammogram in March of this year presents due to tenderness of the right breast.  She only notices it when she is reaching across her anterior chest, her upper inner arm will press against the right breast and she has tenderness in 1 area.  She is not been able to palpate a mass there.  She has no pain unless there is pressure applied in that area.  This started approximately 6 months ago.  She is not noted any pattern to the tenderness.  Is not related to her cycles of birth control pill use.No nipple discharge.  She does have a history of fibroadenoma by breast biopsy in the left breast done in 2019.  She is on oral contraceptives. Reviewed notes from recent office visit for rash.  acute onset lower extremity very itchy rash approximately 3 weeks ago.  She was treated with hydroxyzine.  Never took prednisone.  I reviewed pictures of the rash: Multiple red papules noted on lower extremities.  In the last week, the redness in the lower extremities has improved.  However she continues to get new lesions some on her upper extremities, left wrist, right upper thigh.  No hives or vesicles.  No fevers or chills.  No flaking.  No other affected individuals in her family.  No recent travel.  She has been sitting outside watching her daughter played soccer.  No systemic symptoms.   Assessment  1. Mass of upper outer quadrant of right breast   2. Scabies      Plan  Breast mass right: Small tender cystic-like lesion palpable in the right breast, upper outer quadrant.  Recommend ultrasound.  May warrant diagnostic mammogram.  Because its been tender for 6 months, imaging is  warranted.  Last COVID-vaccine October 22 of this year but tenderness was there prior. Possible scabies: Etiology discussed.  Treat with Elimite.  See after visit summary for further instructions.  Improving.  Hydroxyzine as needed.  Do not recommend prednisone at this time.  Follow up: February for complete physical Visit date not found  Orders Placed This Encounter  Procedures   US BREAST LTD UNI LEFT INC AXILLA   Meds ordered this encounter  Medications   permethrin (ELIMITE) 5 % cream    Sig: Apply 1 application topically once for 1 dose. Repeat in 1 week if new lesions develop    Dispense:  60 g    Refill:  1      I reviewed the patients updated PMH, FH, and SocHx.    Patient Active Problem List   Diagnosis Date Noted   Family history of early CAD 03/17/2020    Priority: High   Moderate mixed hyperlipidemia not requiring statin therapy 03/17/2020    Priority: High   Chronic idiopathic pericarditis 05/13/2017    Priority: High   Obesity (BMI 30-39.9) 05/13/2017    Priority: High   Colon polyp 10/12/2020    Priority: Medium    Breast fibroadenoma, left 08/12/2019    Priority: Medium    Oral contraceptive pill surveillance 08/04/2019    Priority: Medium    PSVT (  paroxysmal supraventricular tachycardia) ([place]) 05/13/2017    Priority: Medium    Current Meds  Medication Sig   diltiazem (CARDIZEM CD) 240 MG 24 hr capsule TAKE 1 CAPSULE BY MOUTH EVERY DAY   diltiazem (CARDIZEM) 30 MG tablet Take one tablet by mouth every 6 hours as needed for palpitations.   norgestimate-ethinyl estradiol (ORTHO-CYCLEN) 0.25-35 MG-MCG tablet TAKE 1 TABLET BY MOUTH EVERY DAY   permethrin (ELIMITE) 5 % cream Apply 1 application topically once for 1 dose. Repeat in 1 week if new lesions develop    Allergies: Patient has No Known Allergies. Family History: Patient family history includes Arthritis in her mother; Diabetes in her father; Early death in her father; Heart attack (age of onset:  37) in her father; Heart disease in her father; High Cholesterol in her father; Lung cancer in her paternal grandmother; Skin cancer in her maternal grandfather; Thyroid disease in her mother. Social History:  Patient  reports that she has never smoked. She has never used smokeless tobacco. She reports current alcohol use of about 1.0 standard drink per week. She reports that she does not use drugs.  Review of Systems: Constitutional: Negative for fever malaise or anorexia Cardiovascular: negative for chest pain Respiratory: negative for SOB or persistent cough Gastrointestinal: negative for abdominal pain  Objective  Vitals: BP 116/78   Pulse 68   Temp 97.8 F (36.6 C)   Ht 5\' 8"  (1.727 m)   Wt 245 lb 9.6 oz (111.4 kg)   LMP 04/20/2021 (Exact Date)   SpO2 98%   BMI 37.34 kg/m  General: no acute distress , A&Ox3 Right breast exam: Large pendulous breasts, less than 1 cm tender mobile nodule palpable in the right upper outer quadrant, reproduces pain.  No other masses noted.  No nipple discharge.  No lymphadenopathy axillary Skin:  Warm, bilateral legs with remnant papules, left upper inner arm with new papule, no excoriation, urticaria or vesicles.  Pictures on her phone show erythematous papules with some burrows noted.   Commons side effects, risks, benefits, and alternatives for medications and treatment plan prescribed today were discussed, and the patient expressed understanding of the given instructions. Patient is instructed to call or message via MyChart if he/she has any questions or concerns regarding our treatment plan. No barriers to understanding were identified. We discussed Red Flag symptoms and signs in detail. Patient expressed understanding regarding what to do in case of urgent or emergency type symptoms.  Medication list was reconciled, printed and provided to the patient in AVS. Patient instructions and summary information was reviewed with the patient as documented in  the AVS. This note was prepared with assistance of Dragon voice recognition software. Occasional wrong-word or sound-a-like substitutions may have occurred due to the inherent limitations of voice recognition software  This visit occurred during the SARS-CoV-2 public health emergency.  Safety protocols were in place, including screening questions prior to the visit, additional usage of staff PPE, and extensive cleaning of exam room while observing appropriate contact time as indicated for disinfecting solutions.

## 2021-05-01 NOTE — Patient Instructions (Signed)
Please return in February 2023 for your annual complete physical; please come fasting.   We will call you to get a breast ultrasound and/or mammogram scheduled.   If you have any questions or concerns, please don't hesitate to send me a message via MyChart or call the office at [phone]. Thank you for visiting with [place] today! It's our pleasure caring for you.   Scabies, Adult Scabies is a skin condition that happens when very small insects called mites get under the skin (infestation). This causes a rash and severe itchiness. Scabies is contagious, which means it can spread from person to person. If you get scabies, it is common for others in your household to get scabies too. With proper treatment, symptoms usually go away in 2-4 weeks. Scabies usually does not cause lasting problems. What are the causes? This condition is caused by tiny mites (Sarcoptes scabiei, or human itch mites) that can only be seen with a microscope. The mites get into the top layer of skin and lay eggs. Scabies can spread from person to person through: Close contact with a person who has scabies. Sharing or having contact with infested items, such as towels, bedding, or clothing. What increases the risk? The following factors may make you more likely to develop this condition: Living in a nursing home or other extended care facility. Having sexual contact with a partner who has scabies. Caring for others who are at increased risk for scabies. What are the signs or symptoms? Symptoms of this condition include: Severe itchiness. This is often worse at night. A rash that includes tiny red bumps or blisters. The rash commonly occurs on the hands, wrists, elbows, armpits, chest, waist, groin, or buttocks. The bumps may form a line (burrow) in some areas. Skin irritation. This can include scaly patches or sores. How is this diagnosed? This condition may be diagnosed based on: A physical exam of the skin. A skin test.  Your health care provider may take a sample of your affected skin (skin scraping) and have it examined under a microscope for signs of mites. How is this treated? This condition may be treated with: Medicated cream or lotion that kills the mites. This is spread on the entire body and left on for several hours. Usually, one treatment with medicated cream or lotion is enough to kill all the mites. In severe cases, the treatment may need to be repeated. Medicated cream that relieves itching. Medicines taken by mouth (orally) that: Relieve itching. Reduce the swelling and redness. Kill the mites. This treatment may be done in severe cases. Follow these instructions at home: Medicines Take or apply over-the-counter and prescription medicines only as told by your health care provider. Apply medicated cream or lotion as told by your health care provider. Do not wash off the medicated cream or lotion until the necessary amount of time has passed. Skin care Avoid scratching the affected areas of your skin. Keep your fingernails closely trimmed to reduce injury from scratching. Take cool baths or apply cool washcloths to your skin to help reduce itching. General instructions Clean all items that you had contact with during the 3 days before diagnosis. This includes bedding, clothing, towels, and furniture. Do this on the same day that you start treatment. Dry-clean items, or use hot water to wash items. Dry items on the hot dry cycle. Place items that cannot be washed into closed, airtight plastic bags for at least 3 days. The mites cannot live for more than 3  days away from human skin. Vacuum furniture and mattresses that you use. Make sure that other people who may have been infested are examined by a health care provider. These include members of your household and anyone who may have had contact with infested items. Keep all follow-up visits. This is important. Where to find more  information Centers for Disease Control and Prevention: http://www.wolf.info/ Contact a health care provider if: You have itching that does not go away after 4 weeks of treatment. You continue to develop new bumps or burrows. You have redness, swelling, or pain in your rash area after treatment. You have fluid, blood, or pus coming from your rash. Summary Scabies is a skin condition that causes a rash and severe itchiness. This condition is caused by tiny mites that get into the top layer of the skin and lay eggs. Scabies can spread from person to person. Follow treatments as recommended by your health care provider. Clean all items that you recently had contact with. This information is not intended to replace advice given to you by your health care provider. Make sure you discuss any questions you have with your health care provider. Document Revised: 10/16/2019 Document Reviewed: 10/16/2019 Elsevier Patient Education  [place].

## 2021-05-03 ENCOUNTER — Telehealth: Payer: Self-pay

## 2021-05-03 NOTE — Telephone Encounter (Signed)
Spoke with patient, gave a verbal understanding °

## 2021-05-03 NOTE — Telephone Encounter (Signed)
Please advise 

## 2021-05-03 NOTE — Telephone Encounter (Signed)
Patient states she was prescribed permethrin.  States she finished this cream up Monday.    States she has now had 3 new spots come up.  Would like to know if she needs another round of medication?    Patient uses [facility] on [address]

## 2021-05-04 ENCOUNTER — Other Ambulatory Visit: Payer: Self-pay | Admitting: Family Medicine

## 2021-05-04 DIAGNOSIS — N6320 Unspecified lump in the left breast, unspecified quadrant: Secondary | ICD-10-CM

## 2021-05-09 ENCOUNTER — Other Ambulatory Visit: Payer: Self-pay | Admitting: Family Medicine

## 2021-05-09 DIAGNOSIS — N6311 Unspecified lump in the right breast, upper outer quadrant: Secondary | ICD-10-CM

## 2021-05-15 ENCOUNTER — Other Ambulatory Visit: Payer: Self-pay

## 2021-05-15 ENCOUNTER — Ambulatory Visit (INDEPENDENT_AMBULATORY_CARE_PROVIDER_SITE_OTHER)
Admission: RE | Admit: 2021-05-15 | Discharge: 2021-05-15 | Disposition: A | Discharge status: Home / Self Care | Payer: Self-pay | Source: Ambulatory Visit | Attending: Cardiology | Admitting: Cardiology

## 2021-05-15 DIAGNOSIS — Z8249 Family history of ischemic heart disease and other diseases of the circulatory system: Secondary | ICD-10-CM

## 2021-05-19 ENCOUNTER — Other Ambulatory Visit: Payer: Self-pay

## 2021-05-19 ENCOUNTER — Ambulatory Visit
Admission: RE | Admit: 2021-05-19 | Discharge: 2021-05-19 | Disposition: A | Discharge status: Home / Self Care | Payer: BC Managed Care – PPO | Source: Ambulatory Visit | Attending: Family Medicine | Admitting: Family Medicine

## 2021-05-19 DIAGNOSIS — R922 Inconclusive mammogram: Secondary | ICD-10-CM | POA: Diagnosis not present

## 2021-05-19 DIAGNOSIS — N6311 Unspecified lump in the right breast, upper outer quadrant: Secondary | ICD-10-CM

## 2021-05-19 DIAGNOSIS — N644 Mastodynia: Secondary | ICD-10-CM | POA: Diagnosis not present

## 2021-06-15 ENCOUNTER — Other Ambulatory Visit: Payer: BC Managed Care – PPO

## 2021-08-15 ENCOUNTER — Encounter: Payer: Self-pay | Admitting: Family Medicine

## 2021-08-15 ENCOUNTER — Other Ambulatory Visit (HOSPITAL_COMMUNITY)
Admission: RE | Admit: 2021-08-15 | Discharge: 2021-08-15 | Disposition: A | Discharge status: Home / Self Care | Payer: BC Managed Care – PPO | Source: Ambulatory Visit | Attending: Family Medicine | Admitting: Family Medicine

## 2021-08-15 ENCOUNTER — Other Ambulatory Visit: Payer: Self-pay

## 2021-08-15 ENCOUNTER — Ambulatory Visit (INDEPENDENT_AMBULATORY_CARE_PROVIDER_SITE_OTHER): Payer: BC Managed Care – PPO | Admitting: Family Medicine

## 2021-08-15 VITALS — BP 122/74 | Temp 97.9°F | Ht 68.0 in | Wt 247.4 lb

## 2021-08-15 DIAGNOSIS — E782 Mixed hyperlipidemia: Secondary | ICD-10-CM | POA: Diagnosis not present

## 2021-08-15 DIAGNOSIS — I319 Disease of pericardium, unspecified: Secondary | ICD-10-CM

## 2021-08-15 DIAGNOSIS — I471 Supraventricular tachycardia: Secondary | ICD-10-CM

## 2021-08-15 DIAGNOSIS — Z9189 Other specified personal risk factors, not elsewhere classified: Secondary | ICD-10-CM

## 2021-08-15 DIAGNOSIS — Z3041 Encounter for surveillance of contraceptive pills: Secondary | ICD-10-CM

## 2021-08-15 DIAGNOSIS — K5140 Inflammatory polyps of colon without complications: Secondary | ICD-10-CM

## 2021-08-15 DIAGNOSIS — E669 Obesity, unspecified: Secondary | ICD-10-CM

## 2021-08-15 DIAGNOSIS — Z8249 Family history of ischemic heart disease and other diseases of the circulatory system: Secondary | ICD-10-CM

## 2021-08-15 DIAGNOSIS — R911 Solitary pulmonary nodule: Secondary | ICD-10-CM | POA: Insufficient documentation

## 2021-08-15 DIAGNOSIS — Z124 Encounter for screening for malignant neoplasm of cervix: Secondary | ICD-10-CM

## 2021-08-15 DIAGNOSIS — Z0000 Encounter for general adult medical examination without abnormal findings: Secondary | ICD-10-CM

## 2021-08-15 LAB — CBC WITH DIFFERENTIAL/PLATELET
Basophils Absolute: 0.1 10*3/uL (ref 0.0–0.1)
Basophils Relative: 0.9 % (ref 0.0–3.0)
Eosinophils Absolute: 0.2 10*3/uL (ref 0.0–0.7)
Eosinophils Relative: 2.1 % (ref 0.0–5.0)
HCT: 40.8 % (ref 36.0–46.0)
Hemoglobin: 13.4 g/dL (ref 12.0–15.0)
Lymphocytes Relative: 24.7 % (ref 12.0–46.0)
Lymphs Abs: 2.1 10*3/uL (ref 0.7–4.0)
MCHC: 32.9 g/dL (ref 30.0–36.0)
MCV: 83.1 fl (ref 78.0–100.0)
Monocytes Absolute: 0.5 10*3/uL (ref 0.1–1.0)
Monocytes Relative: 5.9 % (ref 3.0–12.0)
Neutro Abs: 5.6 10*3/uL (ref 1.4–7.7)
Neutrophils Relative %: 66.4 % (ref 43.0–77.0)
Platelets: 332 10*3/uL (ref 150.0–400.0)
RBC: 4.92 Mil/uL (ref 3.87–5.11)
RDW: 12.7 % (ref 11.5–15.5)
WBC: 8.4 10*3/uL (ref 4.0–10.5)

## 2021-08-15 LAB — TSH: TSH: 0.86 u[IU]/mL (ref 0.35–5.50)

## 2021-08-15 NOTE — Patient Instructions (Signed)

## 2021-08-15 NOTE — Progress Notes (Signed)
Subjective  Chief Complaint  Patient presents with   Annual Exam    Fasting    HPI: [name] is a 51 y.o. female who presents to [facility] today for a Female Wellness Visit. She also has the concerns and/or needs as listed above in the chief complaint. These will be addressed in addition to the Health Maintenance Visit.   Wellness Visit: annual visit with health maintenance review and exam with Pap  HM: due pap. Mammo due next month. On OCPs doing well.   Chronic disease f/u and/or acute problem visit: (deemed necessary to be done in addition to the wellness visit): Chronic pericarditis, family history of CAD and PSVT: Reviewed cardiology annual visits.  Having a bit more palpitations but mostly controlled with her Cardizem.  Still contemplating ablation.  No chest pain.  No shortness of breath.  Cardiac calcium score was 0 last year Weight is up.  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  Working on improving diet.  Exercise is limited. Remains on OCPs: Regular light menstrual cycles.  No adverse effects. History of colon polyps but otherwise normal colonoscopy last year. 3 mm left lower lobe pulmonary nodule noted on calcium score scan.  Reviewed radiology report.  Low risk patient.  No other nodules.  No further imaging required but could consider repeat CT in 12 months.  Patient is low risk.  Non-smoker.  Does have family history of colon cancer in grandmother.  Assessment  1. Annual physical exam   2. Cervical cancer screening   3. Family history of early CAD   21. Moderate mixed hyperlipidemia not requiring statin therapy   5. Chronic idiopathic pericarditis, unspecified complication status   6. Obesity (BMI 30-39.9)   7. Oral contraceptive pill surveillance   8. PSVT (paroxysmal supraventricular tachycardia) ([place])   9. Pseudopolyposis of colon without complication, unspecified part of colon [facility]) Chronic     Plan  Female Wellness  Visit: Age appropriate Health Maintenance and Prevention measures were discussed with patient. Included topics are cancer screening recommendations, ways to keep healthy (see AVS) including dietary and exercise recommendations, regular eye and dental care, use of seat belts, and avoidance of moderate alcohol use and tobacco use.  Pap smear with high-risk HPV done today.  Mammograms due next month colorectal cancer screening up-to-date BMI: discussed patient's BMI and encouraged positive lifestyle modifications to help get to or maintain a target BMI. HM needs and immunizations were addressed and ordered. See below for orders. See HM and immunization section for updates.  Up-to-date Routine labs and screening tests ordered including cmp, cbc and lipids where appropriate. Discussed recommendations regarding Vit D and calcium supplementation (see AVS)  Chronic disease management visit and/or acute problem visit: Pericarditis and [name]: Continue to monitor for symptoms and Cardizem for palpitations.  If worsening, she will follow-up with cardiology for ablation therapy.  Monitor electrolytes and thyroid Cardiac risk stratification: 0 calcium score which is very reassuring.  Check lipids today.  Continue to work on healthy diet and weight loss. 3 mm pulmonary nodule: Can consider repeat lung scan next year however not required.  Patient does have family history of lung cancer but otherwise low risk.  Non-smoker. Continue OCPs for birth control and cycle management.  Follow up: Return in about 1 year (around 08/15/2022) for complete physical.  Orders Placed This Encounter  Procedures   CBC with Differential/Platelet   Comprehensive metabolic panel   Lipid panel   TSH  No orders of the defined types were placed in this encounter.     Body mass index is 37.62 kg/m. Wt Readings from Last 3 Encounters:  08/15/21 247 lb 6.4 oz (112.2 kg)  05/01/21 245 lb 9.6 oz (111.4 kg)  04/21/21 235 lb 3.2 oz  (106.7 kg)     Patient Active Problem List   Diagnosis Date Noted   Family history of early CAD 03/17/2020    Priority: High    Father MI 72, died age 46 Cardiac calcium score 0 2022    Moderate mixed hyperlipidemia not requiring statin therapy 03/17/2020    Priority: High   Chronic idiopathic pericarditis 05/13/2017    Priority: High   Obesity (BMI 30-39.9) 05/13/2017    Priority: High   Colon polyp 10/12/2020    Priority: Medium     Inflammatory polyp w/o dysplasia: Colonoscopy 09/2020, Dr. Havery [name]    Breast fibroadenoma, left 08/12/2019    Priority: Medium     biospy 08/2019    Oral contraceptive pill surveillance 08/04/2019    Priority: Medium    PSVT (paroxysmal supraventricular tachycardia) ([place]) 05/13/2017    Priority: Medium    Health Maintenance  Topic Date Due   MAMMOGRAM  09/06/2021   PAP SMEAR-Modifier  05/13/2022   TETANUS/TDAP  08/19/2022   COLONOSCOPY (Pts 45-32yr Insurance coverage will need to be confirmed)  10/06/2030   INFLUENZA VACCINE  Completed   COVID-19 Vaccine  Completed   Hepatitis C Screening  Completed   HPV VACCINES  Aged Out   HIV Screening  Discontinued   Immunization History  Administered Date(s) Administered   Influenza, Quadrivalent, Recombinant, Inj, Pf 05/17/2019   Influenza,inj,Quad PF,6+ Mos 04/18/2017, 04/18/2018, 03/17/2020   Influenza-Unspecified 03/26/2021   MMR 06/26/2018   Moderna Covid-19 Vaccine Bivalent Booster 162yr& up 04/22/2021   Moderna Sars-Covid-2 Vaccination 09/04/2019, 10/02/2019, 05/08/2020   Tdap 08/19/2012   We updated and reviewed the patient's past history in detail and it is documented below. Allergies: Patient has No Known Allergies. Past Medical History Patient  has a past medical history of Breast fibroadenoma, left (08/12/2019), Chronic idiopathic pericarditis (05/13/2017), Family history of early CAD (03/17/2020), HLD (hyperlipidemia) (09/16/2017), Inflammatory polyps of colon ([facility] (10/12/2020), Obesity (BMI 30-39.9) (05/13/2017), Onychomycosis (07/29/2018), and PSVT (paroxysmal supraventricular tachycardia) ([facility](05/13/2017). Past Surgical History Patient  has a past surgical history that includes Pericardial window; Cesarean section; Breast biopsy (Left, 08/11/2019); and Wisdom tooth extraction. Family History: Patient family history includes Arthritis in her mother; Diabetes in her father; Early death in her father; Heart attack (age of onset: 5466in her father; Heart disease in her father; High Cholesterol in her father; Lung cancer in her paternal grandmother; Skin cancer in her maternal grandfather; Thyroid disease in her mother. Social History:  Patient  reports that she has never smoked. She has never used smokeless tobacco. She reports current alcohol use of about 1.0 standard drink per week. She reports that she does not use drugs.  Review of Systems: Constitutional: negative for fever or malaise Ophthalmic: negative for photophobia, double vision or loss of vision Cardiovascular: negative for chest pain, dyspnea on exertion, or new LE swelling Respiratory: negative for SOB or persistent cough Gastrointestinal: negative for abdominal pain, change in bowel habits or melena Genitourinary: negative for dysuria or gross hematuria, no abnormal uterine bleeding or disharge Musculoskeletal: negative for new gait disturbance or muscular weakness Integumentary: negative for new or persistent rashes, no breast lumps Neurological: negative for TIA or stroke symptoms Psychiatric:  negative for SI or delusions Allergic/Immunologic: negative for hives  Patient Care Team    Relationship Specialty Notifications Start End  Leamon Arnt, MD PCP - General Family Medicine  08/04/19   Constance Haw, MD PCP - Electrophysiology Cardiology Admissions 01/22/18   Burnell Blanks, MD Consulting Physician Cardiology  08/04/19   Yetta Flock, MD Consulting Physician  Gastroenterology  10/12/20     Objective  Vitals: BP 122/74    Temp 97.9 F (36.6 C) (Temporal)    Ht 5' 8"  (1.727 m)    Wt 247 lb 6.4 oz (112.2 kg)    LMP 08/10/2021 (Exact Date)    BMI 37.62 kg/m  General:  Well developed, well nourished, no acute distress  Psych:  Alert and orientedx3,normal mood and affect HEENT:  Normocephalic, atraumatic, non-icteric sclera,  supple neck without adenopathy, mass or thyromegaly Cardiovascular:  Normal S1, S2, RRR without gallop, rub or murmur Respiratory:  Good breath sounds bilaterally, CTAB with normal respiratory effort Gastrointestinal: normal bowel sounds, soft, non-tender, no noted masses. No HSM MSK: no deformities, contusions. Joints are without erythema or swelling.  Skin:  Warm, no rashes or suspicious lesions noted Neurologic:    Mental status is normal. CN 2-11 are normal. Gross motor and sensory exams are normal. Normal gait. No tremor Breast Exam: Patient declined Pelvic Exam: Normal external genitalia, no vulvar or vaginal lesions present. Clear cervix w/o CMT. Bimanual exam reveals a nontender fundus w/o masses, nl size. No adnexal masses present. No inguinal adenopathy. A PAP smear was performed.   Commons side effects, risks, benefits, and alternatives for medications and treatment plan prescribed today were discussed, and the patient expressed understanding of the given instructions. Patient is instructed to call or message via MyChart if he/she has any questions or concerns regarding our treatment plan. No barriers to understanding were identified. We discussed Red Flag symptoms and signs in detail. Patient expressed understanding regarding what to do in case of urgent or emergency type symptoms.  Medication list was reconciled, printed and provided to the patient in AVS. Patient instructions and summary information was reviewed with the patient as documented in the AVS. This note was prepared with assistance of Dragon voice recognition  software. Occasional wrong-word or sound-a-like substitutions may have occurred due to the inherent limitations of voice recognition software  This visit occurred during the SARS-CoV-2 public health emergency.  Safety protocols were in place, including screening questions prior to the visit, additional usage of staff PPE, and extensive cleaning of exam room while observing appropriate contact time as indicated for disinfecting solutions.

## 2021-08-16 LAB — COMPREHENSIVE METABOLIC PANEL
ALT: 10 U/L (ref 0–35)
AST: 13 U/L (ref 0–37)
Albumin: 4.2 g/dL (ref 3.5–5.2)
Alkaline Phosphatase: 75 U/L (ref 39–117)
BUN: 13 mg/dL (ref 6–23)
CO2: 24 mEq/L (ref 19–32)
Calcium: 9.6 mg/dL (ref 8.4–10.5)
Chloride: 103 mEq/L (ref 96–112)
Creatinine, Ser: 0.72 mg/dL (ref 0.40–1.20)
GFR: 98.68 mL/min (ref >60.00)
Glucose, Bld: 83 mg/dL (ref 70–99)
Potassium: 4.2 mEq/L (ref 3.5–5.1)
Sodium: 139 mEq/L (ref 135–145)
Total Bilirubin: 0.5 mg/dL (ref 0.2–1.2)
Total Protein: 7.7 g/dL (ref 6.0–8.3)

## 2021-08-16 LAB — LIPID PANEL
Cholesterol: 272 mg/dL — ABNORMAL HIGH (ref 0–200)
HDL: 66.3 mg/dL (ref >39.00)
LDL Cholesterol: 176 mg/dL — ABNORMAL HIGH (ref 0–99)
NonHDL: 205.56
Total CHOL/HDL Ratio: 4
Triglycerides: 146 mg/dL (ref 0.0–149.0)
VLDL: 29.2 mg/dL (ref 0.0–40.0)

## 2021-08-17 LAB — CYTOLOGY - PAP
Comment: NEGATIVE
Diagnosis: NEGATIVE
High risk HPV: NEGATIVE

## 2021-09-08 ENCOUNTER — Other Ambulatory Visit: Payer: Self-pay | Admitting: Family Medicine

## 2021-09-08 DIAGNOSIS — Z1231 Encounter for screening mammogram for malignant neoplasm of breast: Secondary | ICD-10-CM

## 2021-09-15 ENCOUNTER — Other Ambulatory Visit: Payer: Self-pay

## 2021-09-15 ENCOUNTER — Ambulatory Visit
Admission: RE | Admit: 2021-09-15 | Discharge: 2021-09-15 | Disposition: A | Discharge status: Home / Self Care | Payer: BC Managed Care – PPO | Source: Ambulatory Visit | Attending: Family Medicine | Admitting: Family Medicine

## 2021-09-15 DIAGNOSIS — Z1231 Encounter for screening mammogram for malignant neoplasm of breast: Secondary | ICD-10-CM

## 2021-09-26 DIAGNOSIS — H938X1 Other specified disorders of right ear: Secondary | ICD-10-CM | POA: Diagnosis not present

## 2021-09-26 DIAGNOSIS — H93291 Other abnormal auditory perceptions, right ear: Secondary | ICD-10-CM | POA: Diagnosis not present

## 2021-10-06 DIAGNOSIS — G4730 Sleep apnea, unspecified: Secondary | ICD-10-CM | POA: Diagnosis not present

## 2021-10-06 DIAGNOSIS — E669 Obesity, unspecified: Secondary | ICD-10-CM | POA: Diagnosis not present

## 2021-10-19 DIAGNOSIS — G4733 Obstructive sleep apnea (adult) (pediatric): Secondary | ICD-10-CM | POA: Diagnosis not present

## 2021-11-09 DIAGNOSIS — G4733 Obstructive sleep apnea (adult) (pediatric): Secondary | ICD-10-CM | POA: Diagnosis not present

## 2021-11-09 DIAGNOSIS — E669 Obesity, unspecified: Secondary | ICD-10-CM | POA: Diagnosis not present

## 2022-03-16 ENCOUNTER — Other Ambulatory Visit: Payer: Self-pay | Admitting: Family Medicine

## 2022-03-16 DIAGNOSIS — N926 Irregular menstruation, unspecified: Secondary | ICD-10-CM

## 2022-03-21 ENCOUNTER — Other Ambulatory Visit: Payer: Self-pay

## 2022-03-21 ENCOUNTER — Telehealth: Payer: Self-pay | Admitting: Family Medicine

## 2022-03-21 DIAGNOSIS — N926 Irregular menstruation, unspecified: Secondary | ICD-10-CM

## 2022-03-21 MED ORDER — NORGESTIMATE-ETH ESTRADIOL 0.25-35 MG-MCG PO TABS: 1.0000 | ORAL_TABLET | Freq: Every day | ORAL | 0 refills | Status: DC

## 2022-03-21 NOTE — Telephone Encounter (Signed)
3 month refill sent to pharmacy

## 2022-03-21 NOTE — Telephone Encounter (Signed)
Patient states she is no longer in [place] and tried to have the following RX transferred back to [facility] but [place] told Patient they did not receive RX that was sent on 03/16/22 (showing as Print).  Patient requests the following RX be sent as follows:   LAST APPOINTMENT DATE:  Please schedule appointment if longer than 1 year  08/1421  NEXT APPOINTMENT DATE:  MEDICATION:ESTARYLLA 0.25-35 MG-MCG tablet   or  Generic Version  Is the patient out of medication? Will be out of medication by 03/24/22  PHARMACY: [id number] [place], [place] [address]  [phone] Fax:  [phone]      Let patient know to contact pharmacy at the end of the day to make sure medication is ready.  Please notify patient to allow 48-72 hours to process

## 2022-03-26 ENCOUNTER — Encounter: Payer: Self-pay | Admitting: *Deleted

## 2022-04-28 ENCOUNTER — Other Ambulatory Visit: Payer: Self-pay | Admitting: Cardiology

## 2022-05-09 ENCOUNTER — Emergency Department (HOSPITAL_COMMUNITY)
Admission: EM | Admit: 2022-05-09 | Discharge: 2022-05-09 | Discharge status: Against Medical Advice | Payer: BC Managed Care – PPO

## 2022-05-30 ENCOUNTER — Other Ambulatory Visit: Payer: Self-pay | Admitting: Cardiology

## 2022-06-12 ENCOUNTER — Other Ambulatory Visit: Payer: Self-pay | Admitting: Family Medicine

## 2022-06-12 DIAGNOSIS — N926 Irregular menstruation, unspecified: Secondary | ICD-10-CM

## 2022-06-14 ENCOUNTER — Encounter: Payer: Self-pay | Admitting: *Deleted

## 2022-08-07 DIAGNOSIS — I499 Cardiac arrhythmia, unspecified: Secondary | ICD-10-CM | POA: Diagnosis not present

## 2022-08-07 DIAGNOSIS — R002 Palpitations: Secondary | ICD-10-CM | POA: Diagnosis not present

## 2022-08-07 DIAGNOSIS — Z79899 Other long term (current) drug therapy: Secondary | ICD-10-CM | POA: Diagnosis not present

## 2022-08-07 DIAGNOSIS — I471 Supraventricular tachycardia, unspecified: Secondary | ICD-10-CM | POA: Diagnosis not present

## 2022-08-07 DIAGNOSIS — I10 Essential (primary) hypertension: Secondary | ICD-10-CM | POA: Diagnosis not present

## 2022-08-07 DIAGNOSIS — R000 Tachycardia, unspecified: Secondary | ICD-10-CM | POA: Diagnosis not present

## 2022-08-09 DIAGNOSIS — I471 Supraventricular tachycardia, unspecified: Secondary | ICD-10-CM | POA: Diagnosis not present

## 2022-08-15 ENCOUNTER — Other Ambulatory Visit: Payer: Self-pay | Admitting: Family Medicine

## 2022-08-15 DIAGNOSIS — Z1231 Encounter for screening mammogram for malignant neoplasm of breast: Secondary | ICD-10-CM

## 2022-08-17 ENCOUNTER — Encounter: Payer: Self-pay | Admitting: Cardiology

## 2022-08-17 ENCOUNTER — Ambulatory Visit: Payer: BC Managed Care – PPO | Attending: Cardiology | Admitting: Cardiology

## 2022-08-17 VITALS — BP 138/70 | HR 66 | Ht 68.0 in | Wt 252.0 lb

## 2022-08-17 DIAGNOSIS — I471 Supraventricular tachycardia, unspecified: Secondary | ICD-10-CM | POA: Diagnosis not present

## 2022-08-17 DIAGNOSIS — R911 Solitary pulmonary nodule: Secondary | ICD-10-CM

## 2022-08-17 MED ORDER — DILTIAZEM HCL 30 MG PO TABS: ORAL_TABLET | ORAL | 3 refills | Status: DC

## 2022-08-17 MED ORDER — DILTIAZEM HCL ER COATED BEADS 300 MG PO CP24: 300.0000 mg | ORAL_CAPSULE | Freq: Every day | ORAL | 6 refills | Status: DC

## 2022-08-17 NOTE — Progress Notes (Addendum)
Electrophysiology Office Note   Date:  08/17/2022   ID:  [name], [name] [birth year], MRN [id number]  PCP:  Leamon Arnt, MD  Cardiologist:  Angelena Form Primary Electrophysiologist:  Daylee Delahoz Meredith Leeds, MD    No chief complaint on file.     History of Present Illness: [name] is a 52 y.o. female who is being seen today for the evaluation of [name] at the request of Darlina Guys. She is a Pharmacist, hospital at [employer]. Presenting today for electrophysiology evaluation.    She has a history stated for pericarditis post pericardial window.  She previously was seen by general cardiology for [name].  She had recurrent idiopathic pericarditis [place] and diagnosed 2008.  At the time she had pericardial window at [facility].  She was treated with methotrexate.  She was previously on as needed diltiazem for [name] but had more episodes after having COVID October 2020.  She was started on long-acting diltiazem.  Today, denies symptoms of palpitations, chest pain, shortness of breath, orthopnea, PND, lower extremity edema, claudication, dizziness, presyncope, syncope, bleeding, or neurologic sequela. The patient is tolerating medications without difficulties.  She was at [place] [facility] in 08/07/2022 when she went into [name].  She [place] the game and received IV diltiazem which converted her back to sinus rhythm.  She does feel that she potentially missed her morning dose of diltiazem.  Since taking her medication, she has had no further episodes of [name].  She is potentially interested in ablation.   Past Medical History:  Diagnosis Date   Breast fibroadenoma, left 08/12/2019   biospy   Chronic idiopathic pericarditis 05/13/2017   Family history of early CAD April 10, 2020   Father died age 66   HLD (hyperlipidemia) 09/16/2017   diet controlled   Inflammatory polyps of colon ([place]) 10/12/2020   Colonoscopy 09/2020, Dr. Havery [name]   Obesity (BMI 30-39.9) 05/13/2017   Onychomycosis 07/29/2018    PSVT (paroxysmal supraventricular tachycardia) 05/13/2017   dx 2018   Past Surgical History:  Procedure Laterality Date   BREAST BIOPSY Left 08/11/2019   CESAREAN SECTION     PERICARDIAL WINDOW     WISDOM TOOTH EXTRACTION       Current Outpatient Medications  Medication Sig Dispense Refill   diltiazem (CARDIZEM CD) 300 MG 24 hr capsule Take 1 capsule (300 mg total) by mouth daily. 30 capsule 6   norgestimate-ethinyl estradiol (ORTHO-CYCLEN) 0.25-35 MG-MCG tablet TAKE 1 TABLET BY MOUTH EVERY DAY 84 tablet 0   diltiazem (CARDIZEM) 30 MG tablet Take one tablet by mouth every 6 hours as needed for palpitations. 30 tablet 3   No current facility-administered medications for this visit.    Allergies:   Patient has no known allergies.   Social History:  The patient  reports that she has never smoked. She has never used smokeless tobacco. She reports current alcohol use of about 1.0 standard drink of alcohol per week. She reports that she does not use drugs.   Family History:  The patient's family history includes Arthritis in her mother; Diabetes in her father; Early death in her father; Heart attack (age of onset: 32) in her father; Heart disease in her father; High Cholesterol in her father; Lung cancer in her paternal grandmother; Skin cancer in her maternal grandfather; Thyroid disease in her mother.   ROS:  Please see the history of present illness.   Otherwise, review of systems is positive for none.   All other systems are reviewed and negative.  PHYSICAL EXAM: VS:  BP 138/70   Pulse 66   Ht 5' 8"$  (1.727 m)   Wt 252 lb (114.3 kg)   SpO2 98%   BMI 38.32 kg/m  , BMI Body mass index is 38.32 kg/m. GEN: Well nourished, well developed, in no acute distress  HEENT: normal  Neck: no JVD, carotid bruits, or masses Cardiac: RRR; no murmurs, rubs, or gallops,no edema  Respiratory:  clear to auscultation bilaterally, normal work of breathing GI: soft, nontender, nondistended, +  BS MS: no deformity or atrophy  Skin: warm and dry Neuro:  Strength and sensation are intact Psych: euthymic mood, full affect  EKG:  EKG is ordered today. Personal review of the ekg ordered shows sinus rhythm   Recent Labs: No results found for requested labs within last 365 days.    Lipid Panel     Component Value Date/Time   CHOL 272 (H) 08/15/2021 0934   TRIG 146.0 08/15/2021 0934   HDL 66.30 08/15/2021 0934   CHOLHDL 4 08/15/2021 0934   VLDL 29.2 08/15/2021 0934   LDLCALC 176 (H) 08/15/2021 0934   LDLCALC 156 (H) 03/17/2020 1145     Wt Readings from Last 3 Encounters:  08/17/22 252 lb (114.3 kg)  08/15/21 247 lb 6.4 oz (112.2 kg)  05/01/21 245 lb 9.6 oz (111.4 kg)      Other studies Reviewed: Additional studies/ records that were reviewed today include: TTE 11/29/17  Review of the above records today demonstrates:  - Left ventricle: The cavity size was normal. Systolic function was   normal. The estimated ejection fraction was in the range of 55%   to 60%. Wall motion was normal; there were no regional wall   motion abnormalities. Left ventricular diastolic function   parameters were normal. - Atrial septum: No defect or patent foramen ovale was identified.  30 day monitor 11/29/17 - personally reviewed Sinus rhythm Premature ventricular contractions Several episodes of supraventricular tachycardia ([name])  ASSESSMENT AND PLAN:  1.  [name]: Likely due to AVNRT.  Currently on diltiazem 240 mg daily.  She has had more frequent episodes of [name].  Due to that, we [name] increase diltiazem to 240 mg daily.  She would potentially agree to ablation, though she would like to see how she feels on this higher dose of diltiazem.  Risk and benefits of ablation have been discussed.  Risk of bleeding, tamponade, heart block, stroke, damage surrounding organs, death.  She understands the risks.  She [name] call us back if she wishes to proceed.  2.  Chronic pericarditis: No current  chest pain.  Plan per primary cardiology.  3.  Pulmonary nodule: Noted on prior CT scan.  Patient has a history of cancer in her family.  Wishes for referral to pulmonary medicine.  Current medicines are reviewed at length with the patient today.   The patient does not have concerns regarding her medicines.  The following changes were made today: increase diltiazem  Labs/ tests ordered today include:  Orders Placed This Encounter  Procedures   Ambulatory referral to Pulmonology   EKG 12-Lead     Disposition:   FU 6 months  Signed, Pamela Salahuddin Meredith Leeds, MD  08/17/2022 10:43 AM     [address] [place] [address] [place] [address] [phone] (office) [phone] (fax)

## 2022-08-17 NOTE — Patient Instructions (Signed)
Medication Instructions:  Your physician has recommended you make the following change in your medication:  Increase Diltiazem to 300 mg once daily  *If you need a refill on your cardiac medications before your next appointment, please call your pharmacy*   Lab Work: None ordered   Testing/Procedures: None ordered   Follow-Up: At [facility], you and your health needs are our priority.  As part of our continuing mission to provide you with exceptional heart care, we have created designated Provider Care Teams.  These Care Teams include your primary Cardiologist (physician) and Advanced Practice Providers (APPs -  Physician Assistants and Nurse Practitioners) who all work together to provide you with the care you need, when you need it.  Your next appointment:   6 month(s)  The format for your next appointment:   In Person  Provider:   Allegra Lai, MD    Thank you for choosing [facility]!!   Trinidad Curet, RN [phone]

## 2022-08-29 ENCOUNTER — Other Ambulatory Visit: Payer: Self-pay | Admitting: Cardiology

## 2022-08-30 ENCOUNTER — Institutional Professional Consult (permissible substitution): Payer: BC Managed Care – PPO | Admitting: Student

## 2022-08-31 ENCOUNTER — Other Ambulatory Visit: Payer: Self-pay | Admitting: Family Medicine

## 2022-08-31 DIAGNOSIS — N926 Irregular menstruation, unspecified: Secondary | ICD-10-CM

## 2022-09-07 ENCOUNTER — Ambulatory Visit: Payer: BC Managed Care – PPO | Admitting: Family Medicine

## 2022-09-07 ENCOUNTER — Encounter: Payer: Self-pay | Admitting: Family Medicine

## 2022-09-07 VITALS — BP 130/80 | HR 83 | Temp 98.2°F | Ht 68.0 in | Wt 253.0 lb

## 2022-09-07 DIAGNOSIS — M151 Heberden's nodes (with arthropathy): Secondary | ICD-10-CM

## 2022-09-07 DIAGNOSIS — I471 Supraventricular tachycardia, unspecified: Secondary | ICD-10-CM | POA: Diagnosis not present

## 2022-09-07 DIAGNOSIS — E782 Mixed hyperlipidemia: Secondary | ICD-10-CM | POA: Diagnosis not present

## 2022-09-07 DIAGNOSIS — Z8249 Family history of ischemic heart disease and other diseases of the circulatory system: Secondary | ICD-10-CM

## 2022-09-07 MED ORDER — ROSUVASTATIN CALCIUM 10 MG PO TABS: 10.0000 mg | ORAL_TABLET | Freq: Every day | ORAL | 3 refills | Status: DC

## 2022-09-07 NOTE — Progress Notes (Signed)
Subjective  CC:  Chief Complaint  Patient presents with   Bump on thumb    Pt stated that she notice a bump on her Lt thumb and it hurts hen pressure is applied     HPI: [name] is a 52 y.o. female who presents to the office today to address the problems listed above in the chief complaint. Patient presents with a 1 week history of tender nodule on the PIP joint of her left thumb.  Noticed abruptly last weekend.  Mildly tender especially if she hits it.  No limitations of use.  No redness or warmth.  No injury.  No overuse.  No history of symptomatic osteoarthritis. Hyperlipidemia: Last year LDL was 172.  2 years prior was 190 something.  I recommended statin therapy due to her strong family history of cardiovascular disease.  Patient is here to discuss now.  Diet has not really changed.  Weight is stable.  She would be open to starting statin.  Her atherosclerotic disease risk score was low.  She has an upcoming physical next month and she will be fasting at that time.  No liver problems PSVT: Palpitations becoming more frequent.  Recently had to increase her Cardizem dose due to recurrence of her [name] requiring IV Cardizem.  She had skipped her a.m. dose that day.  Reviewed cardiology notes.  [name] likely due to AVNRT. Assessment  1. [place] node   2. Mixed hyperlipidemia   3. Family history of early CAD   27. PSVT (paroxysmal supraventricular tachycardia)      Plan  Likely [place] node: Educated.  Will monitor.  If increases or becomes inflamed she will return.  For now, Advil as needed.  If worsening or growing, would check x-rays.  Patient agrees Hyperlipidemia with family history of early CAD: Start Crestor 31.  She will return fasting for lab work.  Education and guidance given PSVT: Now stable on 330 mg Cardizem daily.  Monitoring with cardiology  Follow up: For complete physical 10/03/2022  No orders of the defined types were placed in this encounter.  Meds ordered this  encounter  Medications   rosuvastatin (CRESTOR) 10 MG tablet    Sig: Take 1 tablet (10 mg total) by mouth daily.    Dispense:  90 tablet    Refill:  3      I reviewed the patients updated PMH, FH, and SocHx.    Patient Active Problem List   Diagnosis Date Noted   Family history of early CAD 03/17/2020    Priority: High   Moderate mixed hyperlipidemia not requiring statin therapy 03/17/2020    Priority: High   Chronic idiopathic pericarditis 05/13/2017    Priority: High   Obesity (BMI 30-39.9) 05/13/2017    Priority: High   Colon polyp 10/12/2020    Priority: Medium    Breast fibroadenoma, left 08/12/2019    Priority: Medium    Oral contraceptive pill surveillance 08/04/2019    Priority: Medium    PSVT (paroxysmal supraventricular tachycardia) 05/13/2017    Priority: Medium    Pulmonary nodule less than 6 mm in diameter with low risk for malignant neoplasm 08/15/2021   Current Meds  Medication Sig   diltiazem (CARDIZEM CD) 300 MG 24 hr capsule Take 1 capsule (300 mg total) by mouth daily.   diltiazem (CARDIZEM) 30 MG tablet Take one tablet by mouth every 6 hours as needed for palpitations.   norgestimate-ethinyl estradiol (ORTHO-CYCLEN) 0.25-35 MG-MCG tablet TAKE 1 TABLET BY MOUTH EVERY  DAY   rosuvastatin (CRESTOR) 10 MG tablet Take 1 tablet (10 mg total) by mouth daily.    Allergies: Patient has No Known Allergies. Family History: Patient family history includes Arthritis in her mother; Diabetes in her father; Early death in her father; Heart attack (age of onset: 66) in her father; Heart disease in her father; High Cholesterol in her father; Lung cancer in her paternal grandmother; Skin cancer in her maternal grandfather; Thyroid disease in her mother. Social History:  Patient  reports that she has never smoked. She has never used smokeless tobacco. She reports current alcohol use of about 1.0 standard drink of alcohol per week. She reports that she does not use  drugs.  Review of Systems: Constitutional: Negative for fever malaise or anorexia Cardiovascular: negative for chest pain Respiratory: negative for SOB or persistent cough Gastrointestinal: negative for abdominal pain  Objective  Vitals: BP 130/80   Pulse 83   Temp 98.2 F (36.8 C)   Ht '5\' 8"'$  (1.727 m)   Wt 253 lb (114.8 kg)   SpO2 97%   BMI 38.47 kg/m  General: no acute distress , A&Ox3 HEENT: PEERL, conjunctiva normal, neck is supple Cardiovascular:  RRR without murmur or gallop.  Left PIP thumb: Half centimeter mildly tender nonmobile noncystic nodule without erythema, normal range of motion of thumb  Commons side effects, risks, benefits, and alternatives for medications and treatment plan prescribed today were discussed, and the patient expressed understanding of the given instructions. Patient is instructed to call or message via MyChart if he/she has any questions or concerns regarding our treatment plan. No barriers to understanding were identified. We discussed Red Flag symptoms and signs in detail. Patient expressed understanding regarding what to do in case of urgent or emergency type symptoms.  Medication list was reconciled, printed and provided to the patient in AVS. Patient instructions and summary information was reviewed with the patient as documented in the AVS. This note was prepared with assistance of Dragon voice recognition software. Occasional wrong-word or sound-a-like substitutions may have occurred due to the inherent limitations of voice recognition software

## 2022-09-07 NOTE — Patient Instructions (Signed)
Please follow up as scheduled for your next visit with me: 10/03/2022   If you have any questions or concerns, please don't hesitate to send me a message via MyChart or call the office at [phone]. Thank you for visiting with [place] today! It's our pleasure caring for you.   Start crestor once nightly for your cholesterol.  Use advil for the thumb pain; icing may help as well.

## 2022-09-12 NOTE — Progress Notes (Unsigned)
Synopsis: Referred for pulmonary nodule by Leamon Arnt, MD  Subjective:   PATIENT ID: [name] GENDER: female DOB: 14-Jun-1971, MRN: [id number]  No chief complaint on file.  52yF with history of breast fibroadenoma, chronic pericarditis, obesity referred for pulmonary nodule  Otherwise pertinent review of systems is negative.  Past Medical History:  Diagnosis Date   Breast fibroadenoma, left 08/12/2019   biospy   Chronic idiopathic pericarditis 05/13/2017   Family history of early CAD April 15, 2020   Father died age 58   HLD (hyperlipidemia) 09/16/2017   diet controlled   Inflammatory polyps of colon ([place]) 10/12/2020   Colonoscopy 09/2020, Dr. Havery [name]   Obesity (BMI 30-39.9) 05/13/2017   Onychomycosis 07/29/2018   PSVT (paroxysmal supraventricular tachycardia) 05/13/2017   dx 2018     Family History  Problem Relation Age of Onset   Arthritis Mother    Thyroid disease Mother    Diabetes Father    Early death Father        Cardiogenic shock/CAD/MI   High Cholesterol Father    Heart disease Father    Heart attack Father 22       and again at age 35   Skin cancer Maternal Grandfather    Lung cancer Paternal Grandmother        non-smoker   Colon polyps Neg Hx    Colon cancer Neg Hx    Esophageal cancer Neg Hx    Stomach cancer Neg Hx    Rectal cancer Neg Hx      Past Surgical History:  Procedure Laterality Date   BREAST BIOPSY Left 08/11/2019   CESAREAN SECTION     PERICARDIAL WINDOW     WISDOM TOOTH EXTRACTION      Social History   Socioeconomic History   Marital status: Married    Spouse name: Not on file   Number of children: 1   Years of education: Not on file   Highest education level: Not on file  Occupational History   Occupation: Professor    Employer: [employer] Use   Smoking status: Never   Smokeless tobacco: Never  Vaping Use   Vaping Use: Never used  Substance and Sexual Activity   Alcohol use: Yes    Alcohol/week:  1.0 standard drink of alcohol    Types: 1 Glasses of wine per week    Comment: 2x per month   Drug use: No   Sexual activity: Yes    Birth control/protection: Pill  Other Topics Concern   Not on file  Social History Narrative   Not on file   Social Determinants of Health   Financial Resource Strain: Not on file  Food Insecurity: Not on file  Transportation Needs: Not on file  Physical Activity: Not on file  Stress: Not on file  Social Connections: Not on file  Intimate Partner Violence: Not on file     No Known Allergies   Outpatient Medications Prior to Visit  Medication Sig Dispense Refill   diltiazem (CARDIZEM CD) 300 MG 24 hr capsule Take 1 capsule (300 mg total) by mouth daily. 30 capsule 6   diltiazem (CARDIZEM) 30 MG tablet Take one tablet by mouth every 6 hours as needed for palpitations. 30 tablet 3   norgestimate-ethinyl estradiol (ORTHO-CYCLEN) 0.25-35 MG-MCG tablet TAKE 1 TABLET BY MOUTH EVERY DAY 84 tablet 0   rosuvastatin (CRESTOR) 10 MG tablet Take 1 tablet (10 mg total) by mouth daily. 90 tablet 3   No facility-administered  medications prior to visit.       Objective:   Physical Exam:  General appearance: 52 y.o., female, female, NAD, conversant  Eyes: anicteric sclerae; PERRL, tracking appropriately HENT: NCAT; MMM Neck: Trachea midline; no lymphadenopathy, no JVD Lungs: CTAB, no crackles, no wheeze, with normal respiratory effort CV: RRR, no murmur  Abdomen: Soft, non-tender; non-distended, BS present  Extremities: No peripheral edema, warm Skin: Normal turgor and texture; no rash Psych: Appropriate affect Neuro: Alert and oriented to person and place, no focal deficit     There were no vitals filed for this visit.   on *** LPM *** RA BMI Readings from Last 3 Encounters:  09/07/22 38.47 kg/m  08/17/22 38.32 kg/m  08/15/21 37.62 kg/m   Wt Readings from Last 3 Encounters:  09/07/22 253 lb (114.8 kg)  08/17/22 252 lb (114.3 kg)  08/15/21 247  lb 6.4 oz (112.2 kg)     CBC    Component Value Date/Time   WBC 8.4 08/15/2021 0934   RBC 4.92 08/15/2021 0934   HGB 13.4 08/15/2021 0934   HCT 40.8 08/15/2021 0934   PLT 332.0 08/15/2021 0934   MCV 83.1 08/15/2021 0934   MCHC 32.9 08/15/2021 0934   RDW 12.7 08/15/2021 0934   LYMPHSABS 2.1 08/15/2021 0934   MONOABS 0.5 08/15/2021 0934   EOSABS 0.2 08/15/2021 0934   BASOSABS 0.1 08/15/2021 0934    ***  Chest Imaging: CT Chest cardiac 05/15/21 with 64m RLL nodule  Pulmonary Functions Testing Results:     No data to display            Heart Catheterization: ***    Assessment & Plan:   # RLL 361mpulmonary nodule  Plan:      NaMaryjane HurterMD [facility] 09/12/2022 6:34 PM

## 2022-09-13 ENCOUNTER — Encounter: Payer: Self-pay | Admitting: Student

## 2022-09-13 ENCOUNTER — Ambulatory Visit: Payer: BC Managed Care – PPO | Admitting: Student

## 2022-09-13 VITALS — BP 110/74 | HR 75 | Temp 98.2°F | Ht 68.0 in | Wt 253.0 lb

## 2022-09-13 DIAGNOSIS — R911 Solitary pulmonary nodule: Secondary | ICD-10-CM | POA: Diagnosis not present

## 2022-09-13 NOTE — Patient Instructions (Signed)
-   CT Chest you'll be called to schedule - we'll catch up about it with video visit in 4 weeks but feel free to call if you have questions about results beforehand!

## 2022-09-16 ENCOUNTER — Telehealth: Payer: BC Managed Care – PPO | Admitting: Physician Assistant

## 2022-09-16 DIAGNOSIS — J069 Acute upper respiratory infection, unspecified: Secondary | ICD-10-CM | POA: Diagnosis not present

## 2022-09-16 DIAGNOSIS — B9689 Other specified bacterial agents as the cause of diseases classified elsewhere: Secondary | ICD-10-CM | POA: Diagnosis not present

## 2022-09-16 MED ORDER — AMOXICILLIN-POT CLAVULANATE 875-125 MG PO TABS: 1.0000 | ORAL_TABLET | Freq: Two times a day (BID) | ORAL | 0 refills | Status: DC

## 2022-09-16 NOTE — Progress Notes (Signed)
Virtual Visit Consent   [name], you are scheduled for a virtual visit with a [facility] provider today. Just as with appointments in the office, your consent must be obtained to participate. Your consent will be active for this visit and any virtual visit you may have with one of our providers in the next 365 days. If you have a MyChart account, a copy of this consent can be sent to you electronically.  As this is a virtual visit, video technology does not allow for your provider to perform a traditional examination. This may limit your provider's ability to fully assess your condition. If your provider identifies any concerns that need to be evaluated in person or the need to arrange testing (such as labs, EKG, etc.), we will make arrangements to do so. Although advances in technology are sophisticated, we cannot ensure that it will always work on either your end or our end. If the connection with a video visit is poor, the visit may have to be switched to a telephone visit. With either a video or telephone visit, we are not always able to ensure that we have a secure connection.  By engaging in this virtual visit, you consent to the provision of healthcare and authorize for your insurance to be billed (if applicable) for the services provided during this visit. Depending on your insurance coverage, you may receive a charge related to this service.  I need to obtain your verbal consent now. Are you willing to proceed with your visit today? [name] has provided verbal consent on 09/16/2022 for a virtual visit (video or telephone). Mar Daring, PA-C  Date: 09/16/2022 10:06 AM  Virtual Visit via Video Note   I, Mar Daring, connected with  [name]  ([id number], 22-Oct-1970) on 09/16/22 at 10:00 AM EDT by a video-enabled telemedicine application and verified that I am speaking with the correct person using two identifiers.  Location: Patient: Virtual Visit Location  Patient: Home Provider: Virtual Visit Location Provider: Home Office   I discussed the limitations of evaluation and management by telemedicine and the availability of in person appointments. The patient expressed understanding and agreed to proceed.    History of Present Illness: [name] is a 52 y.o. who identifies as a female who was assigned female at birth, and is being seen today for possible sinus infection.  HPI: Sinusitis This is a new problem. The current episode started 1 to 4 weeks ago (1.5 weeks). The problem has been gradually worsening since onset. There has been no fever. Associated symptoms include congestion, coughing (started dry and now productive of green phlegm), headaches and sinus pressure. Pertinent negatives include no chills, ear pain, hoarse voice, shortness of breath or sore throat. Treatments tried: throat lozenges. The treatment provided no relief.   Multiple Covid 19 at home testing all negative    Problems:  Patient Active Problem List   Diagnosis Date Noted   Pulmonary nodule less than 6 mm in diameter with low risk for malignant neoplasm 08/15/2021   Colon polyp 10/12/2020   Family history of early CAD 03/17/2020   Moderate mixed hyperlipidemia not requiring statin therapy 03/17/2020   Breast fibroadenoma, left 08/12/2019   Oral contraceptive pill surveillance 08/04/2019   Chronic idiopathic pericarditis 05/13/2017   PSVT (paroxysmal supraventricular tachycardia) 05/13/2017   Obesity (BMI 30-39.9) 05/13/2017    Allergies: No Known Allergies Medications:  Current Outpatient Medications:    amoxicillin-clavulanate (AUGMENTIN) 875-125 MG tablet, Take 1 tablet by mouth  2 (two) times daily., Disp: 20 tablet, Rfl: 0   diltiazem (CARDIZEM CD) 300 MG 24 hr capsule, Take 1 capsule (300 mg total) by mouth daily., Disp: 30 capsule, Rfl: 6   diltiazem (CARDIZEM) 30 MG tablet, Take one tablet by mouth every 6 hours as needed for palpitations., Disp: 30  tablet, Rfl: 3   norgestimate-ethinyl estradiol (ORTHO-CYCLEN) 0.25-35 MG-MCG tablet, TAKE 1 TABLET BY MOUTH EVERY DAY, Disp: 84 tablet, Rfl: 0   rosuvastatin (CRESTOR) 10 MG tablet, Take 1 tablet (10 mg total) by mouth daily. (Patient not taking: Reported on 09/13/2022), Disp: 90 tablet, Rfl: 3  Observations/Objective: Patient is well-developed, well-nourished in no acute distress.  Resting comfortably at home.  Head is normocephalic, atraumatic.  No labored breathing.  Speech is clear and coherent with logical content.  Patient is alert and oriented at baseline.    Assessment and Plan: 1. Bacterial upper respiratory infection - amoxicillin-clavulanate (AUGMENTIN) 875-125 MG tablet; Take 1 tablet by mouth 2 (two) times daily.  Dispense: 20 tablet; Refill: 0  - Worsening symptoms that have not responded to OTC medications.  - Will give Augmentin - Continue allergy medications.  - Can use Mucinex as needed for cough and congestion - Steam and humidifier can help - Stay well hydrated and get plenty of rest.  - Seek in person evaluation if no symptom improvement or if symptoms worsen   Follow Up Instructions: I discussed the assessment and treatment plan with the patient. The patient was provided an opportunity to ask questions and all were answered. The patient agreed with the plan and demonstrated an understanding of the instructions.  A copy of instructions were sent to the patient via MyChart unless otherwise noted below.    The patient was advised to call back or seek an in-person evaluation if the symptoms worsen or if the condition fails to improve as anticipated.  Time:  I spent 8 minutes with the patient via telehealth technology discussing the above problems/concerns.    Mar Daring, PA-C

## 2022-09-16 NOTE — Patient Instructions (Signed)
Kanita Weiss, thank you for joining Mar Daring, PA-C for today's virtual visit.  While this provider is not your primary care provider (PCP), if your PCP is located in our provider database this encounter information will be shared with them immediately following your visit.   [facility] account gives you access to today's visit and all your visits, tests, and labs performed at [facility] " click here if you don't have a [facility] account or go to [email or web address]  Consent: (Patient) [name] provided verbal consent for this virtual visit at the beginning of the encounter.  Current Medications:  Current Outpatient Medications:    amoxicillin-clavulanate (AUGMENTIN) 875-125 MG tablet, Take 1 tablet by mouth 2 (two) times daily., Disp: 20 tablet, Rfl: 0   diltiazem (CARDIZEM CD) 300 MG 24 hr capsule, Take 1 capsule (300 mg total) by mouth daily., Disp: 30 capsule, Rfl: 6   diltiazem (CARDIZEM) 30 MG tablet, Take one tablet by mouth every 6 hours as needed for palpitations., Disp: 30 tablet, Rfl: 3   norgestimate-ethinyl estradiol (ORTHO-CYCLEN) 0.25-35 MG-MCG tablet, TAKE 1 TABLET BY MOUTH EVERY DAY, Disp: 84 tablet, Rfl: 0   rosuvastatin (CRESTOR) 10 MG tablet, Take 1 tablet (10 mg total) by mouth daily. (Patient not taking: Reported on 09/13/2022), Disp: 90 tablet, Rfl: 3   Medications ordered in this encounter:  Meds ordered this encounter  Medications   amoxicillin-clavulanate (AUGMENTIN) 875-125 MG tablet    Sig: Take 1 tablet by mouth 2 (two) times daily.    Dispense:  20 tablet    Refill:  0    Order Specific Question:   Supervising Provider    Answer:   Chase Picket [id number]     *If you need refills on other medications prior to your next appointment, please contact your pharmacy*  Follow-Up: Call back or seek an in-person evaluation if the symptoms worsen or if the condition fails to improve as  anticipated.  [place] [phone]  Other Instructions  Upper Respiratory Infection, Adult An upper respiratory infection (URI) is a common viral infection of the nose, throat, and upper air passages that lead to the lungs. The most common type of URI is the common cold. URIs usually get better on their own, without medical treatment. What are the causes? A URI is caused by a virus. You may catch a virus by: Breathing in droplets from an infected person's cough or sneeze. Touching something that has been exposed to the virus (is contaminated) and then touching your mouth, nose, or eyes. What increases the risk? You are more likely to get a URI if: You are very young or very old. You have close contact with others, such as at work, school, or a health care facility. You smoke. You have long-term (chronic) heart or lung disease. You have a weakened disease-fighting system (immune system). You have nasal allergies or asthma. You are experiencing a lot of stress. You have poor nutrition. What are the signs or symptoms? A URI usually involves some of the following symptoms: Runny or stuffy (congested) nose. Cough. Sneezing. Sore throat. Headache. Fatigue. Fever. Loss of appetite. Pain in your forehead, behind your eyes, and over your cheekbones (sinus pain). Muscle aches. Redness or irritation of the eyes. Pressure in the ears or face. How is this diagnosed? This condition may be diagnosed based on your medical history and symptoms, and a physical exam. Your health care provider may use a swab to take  a mucus sample from your nose (nasal swab). This sample can be tested to determine what virus is causing the illness. How is this treated? URIs usually get better on their own within 7-10 days. Medicines cannot cure URIs, but your health care provider may recommend certain medicines to help relieve symptoms, such as: Over-the-counter cold medicines. Cough  suppressants. Coughing is a type of defense against infection that helps to clear the respiratory system, so take these medicines only as recommended by your health care provider. Fever-reducing medicines. Follow these instructions at home: Activity Rest as needed. If you have a fever, stay home from work or school until your fever is gone or until your health care provider says your URI cannot spread to other people (is no longer contagious). Your health care provider may have you wear a face mask to prevent your infection from spreading. Relieving symptoms Gargle with a mixture of salt and water 3-4 times a day or as needed. To make salt water, completely dissolve -1 tsp (3-6 g) of salt in 1 cup (237 mL) of warm water. Use a cool-mist humidifier to add moisture to the air. This can help you breathe more easily. Eating and drinking  Drink enough fluid to keep your urine pale yellow. Eat soups and other clear broths. General instructions  Take over-the-counter and prescription medicines only as told by your health care provider. These include cold medicines, fever reducers, and cough suppressants. Do not use any products that contain nicotine or tobacco. These products include cigarettes, chewing tobacco, and vaping devices, such as e-cigarettes. If you need help quitting, ask your health care provider. Stay away from secondhand smoke. Stay up to date on all immunizations, including the yearly (annual) flu vaccine. Keep all follow-up visits. This is important. How to prevent the spread of infection to others URIs can be contagious. To prevent the infection from spreading: Wash your hands with soap and water for at least 20 seconds. If soap and water are not available, use hand sanitizer. Avoid touching your mouth, face, eyes, or nose. Cough or sneeze into a tissue or your sleeve or elbow instead of into your hand or into the air.  Contact a health care provider if: You are getting worse  instead of better. You have a fever or chills. Your mucus is brown or red. You have yellow or brown discharge coming from your nose. You have pain in your face, especially when you bend forward. You have swollen neck glands. You have pain while swallowing. You have white areas in the back of your throat. Get help right away if: You have shortness of breath that gets worse. You have severe or persistent: Headache. Ear pain. Sinus pain. Chest pain. You have chronic lung disease along with any of the following: Making high-pitched whistling sounds when you breathe, most often when you breathe out (wheezing). Prolonged cough (more than 14 days). Coughing up blood. A change in your usual mucus. You have a stiff neck. You have changes in your: Vision. Hearing. Thinking. Mood. These symptoms may be an emergency. Get help right away. Call 911. Do not wait to see if the symptoms will go away. Do not drive yourself to the hospital. Summary An upper respiratory infection (URI) is a common infection of the nose, throat, and upper air passages that lead to the lungs. A URI is caused by a virus. URIs usually get better on their own within 7-10 days. Medicines cannot cure URIs, but your health care provider may  recommend certain medicines to help relieve symptoms. This information is not intended to replace advice given to you by your health care provider. Make sure you discuss any questions you have with your health care provider. Document Revised: 01/18/2021 Document Reviewed: 01/18/2021 Elsevier Patient Education  [place].    If you have been instructed to have an in-person evaluation today at a local Urgent Care facility, please use the link below. It will take you to a list of all of our available [facility], including address, phone number and hours of operation. Please do not delay care.  [facility]  If you or a family member do not have a  primary care provider, use the link below to schedule a visit and establish care. When you choose a [facility] primary care physician or advanced practice provider, you gain a long-term partner in health. Find a Primary Care Provider  Learn more about [facility]'s in-office and virtual care options: [facility]

## 2022-10-03 ENCOUNTER — Ambulatory Visit (INDEPENDENT_AMBULATORY_CARE_PROVIDER_SITE_OTHER): Payer: BC Managed Care – PPO | Admitting: Family Medicine

## 2022-10-03 ENCOUNTER — Encounter: Payer: Self-pay | Admitting: Family Medicine

## 2022-10-03 VITALS — BP 118/64 | HR 78 | Temp 98.0°F | Ht 68.0 in | Wt 255.0 lb

## 2022-10-03 DIAGNOSIS — Z23 Encounter for immunization: Secondary | ICD-10-CM

## 2022-10-03 DIAGNOSIS — Z3041 Encounter for surveillance of contraceptive pills: Secondary | ICD-10-CM

## 2022-10-03 DIAGNOSIS — E782 Mixed hyperlipidemia: Secondary | ICD-10-CM

## 2022-10-03 DIAGNOSIS — Z0000 Encounter for general adult medical examination without abnormal findings: Secondary | ICD-10-CM | POA: Diagnosis not present

## 2022-10-03 DIAGNOSIS — I319 Disease of pericardium, unspecified: Secondary | ICD-10-CM | POA: Diagnosis not present

## 2022-10-03 DIAGNOSIS — I471 Supraventricular tachycardia, unspecified: Secondary | ICD-10-CM

## 2022-10-03 DIAGNOSIS — E669 Obesity, unspecified: Secondary | ICD-10-CM | POA: Diagnosis not present

## 2022-10-03 NOTE — Progress Notes (Signed)
Subjective  Chief Complaint  Patient presents with   Annual Exam    Pt here for Annual Exam and is not currently fasting    HPI: [name] is a 52 y.o. female who presents to [facility] [place] today for a Female Wellness Visit. She also has the concerns and/or needs as listed above in the chief complaint. These will be addressed in addition to the Health Maintenance Visit.   Wellness Visit: annual visit with health maintenance review and exam without Pap  Health maintenance: Pap smear current.  Mammogram scheduled for later this month.  Colorectal cancer screening up-to-date.  Doing well overall.  Eligible for Tdap today Chronic disease f/u and/or acute problem visit: (deemed necessary to be done in addition to the wellness visit): Chronic idiopathic pericarditis with history of PSVT: Increase Cardizem dose and has done well except when she skipped 1 dose recently.  No chest pain. Oral contraceptive use: Will be 52 in a few months.  Does not want to become pregnant.  Has been stable and has utilized oral contraceptive pills for many years.  Does report that she had some spotting last week after using amoxicillin for the week prior.  Currently on placebo week.  No contraindications to hormone use.  Does have strong family history of cardiovascular disease but her recent cardiac testing showed a 0 calcium score.  She does have hyperlipidemia Hyperlipidemia: Started Crestor about 2-4 weeks ago.  Tolerating well.  Will return for fasting lab work.   Assessment  1. Annual physical exam   2. Need for Tdap vaccination   3. Chronic idiopathic pericarditis, unspecified complication status   4. Mixed hyperlipidemia   5. Obesity (BMI 30-39.9)   6. PSVT (paroxysmal supraventricular tachycardia)   7. Oral contraceptive pill surveillance      Plan  Female Wellness Visit: Age appropriate Health Maintenance and Prevention measures were discussed with patient. Included  topics are cancer screening recommendations, ways to keep healthy (see AVS) including dietary and exercise recommendations, regular eye and dental care, use of seat belts, and avoidance of moderate alcohol use and tobacco use.  BMI: discussed patient's BMI and encouraged positive lifestyle modifications to help get to or maintain a target BMI. HM needs and immunizations were addressed and ordered. See below for orders. See HM and immunization section for updates.  Tdap booster given today Routine labs and screening tests ordered including cmp, cbc and lipids where appropriate. Discussed recommendations regarding Vit D and calcium supplementation (see AVS)  Chronic disease management visit and/or acute problem visit: Chronic idiopathic pericarditis and PSVT: Stable on Cardizem.  Followed closely by cardiology.  Reviewed notes.  She is considering ablation mixed hyperlipidemia now on Crestor 10 mg nightly and tolerating well.  Will return for fasting lipid check more of a baseline than anything since she was started Crestor.  Check LFTs Oral contraceptives: Discussed benefits versus risks.  Discuss perimenopausal changes.  I am comfortable with her remaining on oral contraceptive pills for the next several years.  She will return next week to check an [facility], if seems to be.  Postmenopausal can consider coming off early.  Patient agrees Follow up: 12 months for complete physical Orders Placed This Encounter  Procedures   Tdap vaccine greater than or equal to 7yo IM   CBC with Differential/Platelet   Comprehensive metabolic panel   Follicle stimulating hormone   Lipid panel   TSH   No orders of the defined types were placed  in this encounter.     Body mass index is 38.77 kg/m. Wt Readings from Last 3 Encounters:  10/03/22 255 lb (115.7 kg)  09/13/22 253 lb (114.8 kg)  09/07/22 253 lb (114.8 kg)     Patient Active Problem List   Diagnosis Date Noted   Family history of early CAD  03/17/2020    Priority: High    Father MI 61, died age 5 Cardiac calcium score 0 2022    Mixed hyperlipidemia 03/17/2020    Priority: High   Chronic idiopathic pericarditis 05/13/2017    Priority: High   Obesity (BMI 30-39.9) 05/13/2017    Priority: High   Colon polyp 10/12/2020    Priority: Medium     Inflammatory polyp w/o dysplasia: Colonoscopy 09/2020, Dr. Havery [name]    Breast fibroadenoma, left 08/12/2019    Priority: Medium     biospy 08/2019    Oral contraceptive pill surveillance 08/04/2019    Priority: Medium    PSVT (paroxysmal supraventricular tachycardia) 05/13/2017    Priority: Medium    Pulmonary nodule less than 6 mm in diameter with low risk for malignant neoplasm 08/15/2021    Seen in cardiac calcium score skin: 3 mm, left lower lobe.  No other nodules.  Low risk patient.  No further imaging recommended.    Health Maintenance  Topic Date Due   DTaP/Tdap/Td (2 - Td or Tdap) 08/19/2022   MAMMOGRAM  09/16/2022   COVID-19 Vaccine (5 - 2023-24 season) 10/19/2022 (Originally 03/02/2022)   INFLUENZA VACCINE  01/31/2023   PAP SMEAR-Modifier  08/15/2026   COLONOSCOPY (Pts 45-27yrs Insurance coverage will need to be confirmed)  10/06/2030   Hepatitis C Screening  Completed   HPV VACCINES  Aged Out   HIV Screening  Discontinued   Immunization History  Administered Date(s) Administered   Influenza, Quadrivalent, Recombinant, Inj, Pf 05/17/2019   Influenza,inj,Quad PF,6+ Mos 04/18/2017, 04/18/2018, 03/17/2020   Influenza-Unspecified 03/26/2021, 05/13/2022   MMR 06/26/2018   Moderna Covid-19 Vaccine Bivalent Booster 60yrs & up 04/22/2021   Moderna Sars-Covid-2 Vaccination 09/04/2019, 10/02/2019, 05/08/2020   Tdap 08/19/2012   We updated and reviewed the patient's past history in detail and it is documented below. Allergies: Patient has No Known Allergies. Past Medical History Patient  has a past medical history of Breast fibroadenoma, left (08/12/2019), Chronic  idiopathic pericarditis (05/13/2017), Family history of early CAD (03/17/2020), HLD (hyperlipidemia) (09/16/2017), Inflammatory polyps of colon (10/12/2020), Obesity (BMI 30-39.9) (05/13/2017), Onychomycosis (07/29/2018), and PSVT (paroxysmal supraventricular tachycardia) (05/13/2017). Past Surgical History Patient  has a past surgical history that includes Pericardial window; Cesarean section; Breast biopsy (Left, 08/11/2019); and Wisdom tooth extraction. Family History: Patient family history includes Arthritis in her mother; Diabetes in her father; Early death in her father; Heart attack (age of onset: 19) in her father; Heart disease in her father; High Cholesterol in her father; Lung cancer in her paternal grandmother; Skin cancer in her maternal grandfather; Thyroid disease in her mother. Social History:  Patient  reports that she has never smoked. She has never been exposed to tobacco smoke. She has never used smokeless tobacco. She reports current alcohol use of about 1.0 standard drink of alcohol per week. She reports that she does not use drugs.  Review of Systems: Constitutional: negative for fever or malaise Ophthalmic: negative for photophobia, double vision or loss of vision Cardiovascular: negative for chest pain, dyspnea on exertion, or new LE swelling Respiratory: negative for SOB or persistent cough Gastrointestinal: negative for abdominal pain, change in bowel  habits or melena Genitourinary: negative for dysuria or gross hematuria, no abnormal uterine bleeding or disharge Musculoskeletal: negative for new gait disturbance or muscular weakness Integumentary: negative for new or persistent rashes, no breast lumps Neurological: negative for TIA or stroke symptoms Psychiatric: negative for SI or delusions Allergic/Immunologic: negative for hives  Patient Care Team    Relationship Specialty Notifications Start End  Leamon Arnt, MD PCP - General Family Medicine  08/04/19   Constance Haw, MD PCP - Electrophysiology Cardiology Admissions 01/22/18   Burnell Blanks, MD Consulting Physician Cardiology  08/04/19   Yetta Flock, MD Consulting Physician Gastroenterology  10/12/20     Objective  Vitals: BP 118/64   Pulse 78   Temp 98 F (36.7 C)   Ht 5\' 8"  (1.727 m)   Wt 255 lb (115.7 kg)   SpO2 97%   BMI 38.77 kg/m  General:  Well developed, well nourished, no acute distress  Psych:  Alert and orientedx3,normal mood and affect HEENT:  Normocephalic, atraumatic, non-icteric sclera,  supple neck without adenopathy, mass or thyromegaly Cardiovascular:  Normal S1, S2, RRR without gallop, rub or murmur Respiratory:  Good breath sounds bilaterally, CTAB with normal respiratory effort Gastrointestinal: normal bowel sounds, soft, non-tender, no noted masses. No HSM Extremities without edema Skin:  Warm, no rashes or suspicious lesions noted Neurologic:    Mental status is normal. Normal gait. No tremor   Commons side effects, risks, benefits, and alternatives for medications and treatment plan prescribed today were discussed, and the patient expressed understanding of the given instructions. Patient is instructed to call or message via MyChart if he/she has any questions or concerns regarding our treatment plan. No barriers to understanding were identified. We discussed Red Flag symptoms and signs in detail. Patient expressed understanding regarding what to do in case of urgent or emergency type symptoms.  Medication list was reconciled, printed and provided to the patient in AVS. Patient instructions and summary information was reviewed with the patient as documented in the AVS. This note was prepared with assistance of Dragon voice recognition software. Occasional wrong-word or sound-a-like substitutions may have occurred due to the inherent limitations of voice recognition software

## 2022-10-03 NOTE — Patient Instructions (Signed)
Please return in 12 months for your annual complete physical; please come fasting. Schedule a fasting lab appointment for 10/08/2022  I will release your lab results to you on your MyChart account with further instructions. You may see the results before I do, but when I review them I will send you a message with my report or have my assistant call you if things need to be discussed. Please reply to my message with any questions. Thank you!   If you have any questions or concerns, please don't hesitate to send me a message via MyChart or call the office at [phone]. Thank you for visiting with [place] today! It's our pleasure caring for you.   Perimenopause Perimenopause is the normal time of a woman's life when the levels of estrogen, the female hormone produced by the ovaries, begin to decrease. This leads to changes in menstrual periods before they stop completely (menopause). Perimenopause can begin 2-8 years before menopause. During perimenopause, the ovaries may or may not produce an egg and a woman can still become pregnant. What are the causes? This condition is caused by a natural change in hormone levels that happens as you get older. What increases the risk? This condition is more likely to start at an earlier age if you have certain medical conditions or have undergone treatments, including: A tumor of the pituitary gland in the brain. A disease that affects the ovaries and hormone production. Certain cancer treatments, such as chemotherapy or hormone therapy, or radiation therapy on the pelvis. Heavy smoking and excessive alcohol use. Family history of early menopause. What are the signs or symptoms? Perimenopausal changes affect each woman differently. Symptoms of this condition may include: Hot flashes. Irregular menstrual periods. Night sweats. Changes in feelings about sex. This could be a decrease in sex drive or an increased discomfort around your sexuality. Vaginal  dryness. Headaches. Mood swings. Depression. Problems sleeping (insomnia). Memory problems or trouble concentrating. Irritability. Tiredness. Weight gain. Anxiety. Trouble getting pregnant. How is this diagnosed? This condition is diagnosed based on your medical history, a physical exam, your age, your menstrual history, and your symptoms. Hormone tests may also be done. How is this treated? In some cases, no treatment is needed. You and your health care provider should make a decision together about whether treatment is necessary. Treatment will be based on your individual condition and preferences. Various treatments are available, such as: Menopausal hormone therapy (MHT). Medicines to treat specific symptoms. Acupuncture. Vitamin or herbal supplements. Before starting treatment, make sure to let your health care provider know if you have a personal or family history of: Heart disease. Breast cancer. Blood clots. Diabetes. Osteoporosis. Follow these instructions at home: Medicines Take over-the-counter and prescription medicines only as told by your health care provider. Take vitamin supplements only as told by your health care provider. Talk with your health care provider before starting any herbal supplements. Lifestyle  Do not use any products that contain nicotine or tobacco, such as cigarettes, e-cigarettes, and chewing tobacco. If you need help quitting, ask your health care provider. Get at least 30 minutes of physical activity on 5 or more days each week. Eat a balanced diet that includes fresh fruits and vegetables, whole grains, soybeans, eggs, lean meat, and low-fat dairy. Avoid alcoholic and caffeinated beverages, as well as spicy foods. This may help prevent hot flashes. Get 7-8 hours of sleep each night. Dress in layers that can be removed to help you manage hot flashes. Find ways to  manage stress, such as deep breathing, meditation, or journaling. General  instructions  Keep track of your menstrual periods, including: When they occur. How heavy they are and how long they last. How much time passes between periods. Keep track of your symptoms, noting when they start, how often you have them, and how long they last. Use vaginal lubricants or moisturizers to help with vaginal dryness and improve comfort during sex. You can still become pregnant if you are having irregular periods. Make sure you use contraception during perimenopause if you do not want to get pregnant. Keep all follow-up visits. This is important. This includes any group therapy or counseling. Contact a health care provider if: You have heavy vaginal bleeding or pass blood clots. Your period lasts more than 2 days longer than normal. Your periods are recurring sooner than 21 days. You bleed after having sex. You have pain during sex. Get help right away if you have: Chest pain, trouble breathing, or trouble talking. Severe depression. Pain when you urinate. Severe headaches. Vision problems. Summary Perimenopause is the time when a woman's body begins to move into menopause. This may happen naturally or as a result of other health problems or medical treatments. Perimenopause can begin 2-8 years before menopause, and it can last for several years. Perimenopausal symptoms can be managed through medicines, lifestyle changes, and complementary therapies such as acupuncture. This information is not intended to replace advice given to you by your health care provider. Make sure you discuss any questions you have with your health care provider. Document Revised: 12/03/2019 Document Reviewed: 12/03/2019 Elsevier Patient Education  [place].

## 2022-10-04 ENCOUNTER — Ambulatory Visit
Admission: RE | Admit: 2022-10-04 | Discharge: 2022-10-04 | Disposition: A | Discharge status: Home / Self Care | Payer: BC Managed Care – PPO | Source: Ambulatory Visit | Attending: Family Medicine | Admitting: Family Medicine

## 2022-10-04 DIAGNOSIS — Z1231 Encounter for screening mammogram for malignant neoplasm of breast: Secondary | ICD-10-CM | POA: Diagnosis not present

## 2022-10-08 ENCOUNTER — Other Ambulatory Visit (INDEPENDENT_AMBULATORY_CARE_PROVIDER_SITE_OTHER): Payer: BC Managed Care – PPO

## 2022-10-08 DIAGNOSIS — I471 Supraventricular tachycardia, unspecified: Secondary | ICD-10-CM

## 2022-10-08 DIAGNOSIS — I319 Disease of pericardium, unspecified: Secondary | ICD-10-CM

## 2022-10-08 DIAGNOSIS — E782 Mixed hyperlipidemia: Secondary | ICD-10-CM

## 2022-10-08 DIAGNOSIS — Z3041 Encounter for surveillance of contraceptive pills: Secondary | ICD-10-CM

## 2022-10-08 DIAGNOSIS — Z0000 Encounter for general adult medical examination without abnormal findings: Secondary | ICD-10-CM

## 2022-10-08 LAB — COMPREHENSIVE METABOLIC PANEL
ALT: 9 U/L (ref 0–35)
AST: 14 U/L (ref 0–37)
Albumin: 4 g/dL (ref 3.5–5.2)
Alkaline Phosphatase: 77 U/L (ref 39–117)
BUN: 12 mg/dL (ref 6–23)
CO2: 26 mEq/L (ref 19–32)
Calcium: 9.7 mg/dL (ref 8.4–10.5)
Chloride: 104 mEq/L (ref 96–112)
Creatinine, Ser: 0.78 mg/dL (ref 0.40–1.20)
GFR: 88.93 mL/min (ref >60.00)
Glucose, Bld: 85 mg/dL (ref 70–99)
Potassium: 4.3 mEq/L (ref 3.5–5.1)
Sodium: 139 mEq/L (ref 135–145)
Total Bilirubin: 0.4 mg/dL (ref 0.2–1.2)
Total Protein: 6.8 g/dL (ref 6.0–8.3)

## 2022-10-08 LAB — CBC WITH DIFFERENTIAL/PLATELET
Basophils Absolute: 0 10*3/uL (ref 0.0–0.1)
Basophils Relative: 0.6 % (ref 0.0–3.0)
Eosinophils Absolute: 0.4 10*3/uL (ref 0.0–0.7)
Eosinophils Relative: 4.9 % (ref 0.0–5.0)
HCT: 42.7 % (ref 36.0–46.0)
Hemoglobin: 14.4 g/dL (ref 12.0–15.0)
Lymphocytes Relative: 25.5 % (ref 12.0–46.0)
Lymphs Abs: 2 10*3/uL (ref 0.7–4.0)
MCHC: 33.8 g/dL (ref 30.0–36.0)
MCV: 83.4 fl (ref 78.0–100.0)
Monocytes Absolute: 0.5 10*3/uL (ref 0.1–1.0)
Monocytes Relative: 6.2 % (ref 3.0–12.0)
Neutro Abs: 4.8 10*3/uL (ref 1.4–7.7)
Neutrophils Relative %: 62.8 % (ref 43.0–77.0)
Platelets: 313 10*3/uL (ref 150.0–400.0)
RBC: 5.13 Mil/uL — ABNORMAL HIGH (ref 3.87–5.11)
RDW: 13.4 % (ref 11.5–15.5)
WBC: 7.7 10*3/uL (ref 4.0–10.5)

## 2022-10-08 LAB — LIPID PANEL
Cholesterol: 181 mg/dL (ref 0–200)
HDL: 59.3 mg/dL (ref >39.00)
LDL Cholesterol: 101 mg/dL — ABNORMAL HIGH (ref 0–99)
NonHDL: 122.05
Total CHOL/HDL Ratio: 3
Triglycerides: 104 mg/dL (ref 0.0–149.0)
VLDL: 20.8 mg/dL (ref 0.0–40.0)

## 2022-10-08 LAB — TSH: TSH: 0.81 u[IU]/mL (ref 0.35–5.50)

## 2022-10-08 LAB — FOLLICLE STIMULATING HORMONE: FSH: 13.9 m[IU]/mL

## 2022-10-11 ENCOUNTER — Encounter: Payer: Self-pay | Admitting: Family Medicine

## 2022-10-12 ENCOUNTER — Other Ambulatory Visit: Payer: BC Managed Care – PPO

## 2022-11-01 ENCOUNTER — Ambulatory Visit
Admission: RE | Admit: 2022-11-01 | Discharge: 2022-11-01 | Disposition: A | Discharge status: Home / Self Care | Payer: BC Managed Care – PPO | Source: Ambulatory Visit | Attending: Student | Admitting: Student

## 2022-11-01 DIAGNOSIS — R911 Solitary pulmonary nodule: Secondary | ICD-10-CM

## 2022-11-05 ENCOUNTER — Encounter: Payer: Self-pay | Admitting: Family Medicine

## 2022-11-05 DIAGNOSIS — R2230 Localized swelling, mass and lump, unspecified upper limb: Secondary | ICD-10-CM

## 2022-11-18 ENCOUNTER — Other Ambulatory Visit: Payer: Self-pay | Admitting: Family Medicine

## 2022-11-18 DIAGNOSIS — N926 Irregular menstruation, unspecified: Secondary | ICD-10-CM

## 2022-12-13 ENCOUNTER — Encounter: Payer: Self-pay | Admitting: Physician Assistant

## 2022-12-13 ENCOUNTER — Ambulatory Visit: Payer: BC Managed Care – PPO | Admitting: Physician Assistant

## 2022-12-13 VITALS — BP 116/70 | HR 80 | Temp 98.7°F | Ht 68.0 in | Wt 255.8 lb

## 2022-12-13 DIAGNOSIS — M79672 Pain in left foot: Secondary | ICD-10-CM | POA: Diagnosis not present

## 2022-12-13 MED ORDER — MELOXICAM 15 MG PO TABS: ORAL_TABLET | ORAL | 0 refills | Status: DC

## 2022-12-13 NOTE — Progress Notes (Signed)
[name] is a 52 y.o. female here for a new problem.  History of Present Illness:   Chief Complaint  Patient presents with   Foot Pain    Pt stated that she has been having some Lt heel pain for the past several week and it has gotten worse    HPI  Left Heel Pain: She complains of left heel pain worsening about 3-4 weeks ago, consistent with plantar fasciitis.  Her symptoms began after completing a fitness walking challenge with a wellness program. Her pain is worse first thing in the morning and at nighttime.  She has tried alleviating her symptoms by rolling her heel over a frozen water bottle, got new walking shoes, and taking Advil.  She was previously using Shon Baton prior to buying a new pair.  Has stopped her regular walking due to the pain, previously was walking about 2.5-3 miles a day.  Denies any ankle swelling and endorses normal range of motion.    Past Medical History:  Diagnosis Date   Breast fibroadenoma, left 08/12/2019   biospy   Chronic idiopathic pericarditis 05/13/2017   Family history of early CAD 23-Mar-2020   Father died age 19   HLD (hyperlipidemia) 09/16/2017   diet controlled   Inflammatory polyps of colon (HCC) 10/12/2020   Colonoscopy 09/2020, Dr. Adela Lank   Obesity (BMI 30-39.9) 05/13/2017   Onychomycosis 07/29/2018   PSVT (paroxysmal supraventricular tachycardia) 05/13/2017   dx 2018     Social History   Tobacco Use   Smoking status: Never    Passive exposure: Never   Smokeless tobacco: Never  Vaping Use   Vaping Use: Never used  Substance Use Topics   Alcohol use: Yes    Alcohol/week: 1.0 standard drink of alcohol    Types: 1 Glasses of wine per week    Comment: 2x per month   Drug use: No    Past Surgical History:  Procedure Laterality Date   BREAST BIOPSY Left 08/11/2019   CESAREAN SECTION     PERICARDIAL WINDOW     WISDOM TOOTH EXTRACTION      Family History  Problem Relation Age of Onset   Arthritis Mother     Thyroid disease Mother    Diabetes Father    Early death Father        Cardiogenic shock/CAD/MI   High Cholesterol Father    Heart disease Father    Heart attack Father 37       and again at age 15   Skin cancer Maternal Grandfather    Lung cancer Paternal Grandmother        non-smoker   Colon polyps Neg Hx    Colon cancer Neg Hx    Esophageal cancer Neg Hx    Stomach cancer Neg Hx    Rectal cancer Neg Hx     No Known Allergies  Current Medications:   Current Outpatient Medications:    diltiazem (CARDIZEM CD) 300 MG 24 hr capsule, Take 1 capsule (300 mg total) by mouth daily., Disp: 30 capsule, Rfl: 6   diltiazem (CARDIZEM) 30 MG tablet, Take one tablet by mouth every 6 hours as needed for palpitations., Disp: 30 tablet, Rfl: 3   meloxicam (MOBIC) 15 MG tablet, Take 1 tablet daily x 1 week then take as needed, Disp: 30 tablet, Rfl: 0   norgestimate-ethinyl estradiol (ORTHO-CYCLEN) 0.25-35 MG-MCG tablet, TAKE 1 TABLET BY MOUTH EVERY DAY, Disp: 84 tablet, Rfl: 0   rosuvastatin (CRESTOR) 10 MG tablet, Take 1  tablet (10 mg total) by mouth daily., Disp: 90 tablet, Rfl: 3   Review of Systems:   Review of Systems  Musculoskeletal:        (+) Left heel pain -- see HPI     Vitals:   Vitals:   12/13/22 1054  BP: 116/70  Pulse: 80  Temp: 98.7 F (37.1 C)  SpO2: 98%  Weight: 255 lb 12.8 oz (116 kg)  Height: 5\' 8"  (1.727 m)     Body mass index is 38.89 kg/m.  Physical Exam:   Physical Exam Constitutional:      Appearance: Normal appearance. She is well-developed.  HENT:     Head: Normocephalic and atraumatic.  Eyes:     General: Lids are normal.     Extraocular Movements: Extraocular movements intact.     Conjunctiva/sclera: Conjunctivae normal.  Pulmonary:     Effort: Pulmonary effort is normal.  Musculoskeletal:        General: Normal range of motion.     Cervical back: Normal range of motion and neck supple.     Comments: Left heel with slight tenderness to  palpation; normal ROM; no obvious swelling or deformity  Skin:    General: Skin is warm and dry.  Neurological:     Mental Status: She is alert and oriented to person, place, and time.  Psychiatric:        Attention and Perception: Attention and perception normal.        Mood and Affect: Mood normal.        Behavior: Behavior normal.        Thought Content: Thought content normal.        Judgment: Judgment normal.     Assessment and Plan:   Pain of left heel Suspect plantar fasciitis Recommend heel cup Start mobic 15 mg daily x 1 week, then as needed thereafter If new/worsening symptom(s) or lack of improvement, will refer to sports medicine vs physical therapy   I,Rachel Rivera,acting as a scribe for Jarold Motto, PA.,have documented all relevant documentation on the behalf of Jarold Motto, PA,as directed by  Jarold Motto, PA while in the presence of Jarold Motto, [place].  I, Jarold Motto, [place], have reviewed all documentation for this visit. The documentation on 12/13/22 for the exam, diagnosis, procedures, and orders are all accurate and complete.   Jarold Motto, PA-C

## 2022-12-13 NOTE — Patient Instructions (Signed)
It was great to see you!  Purchase heel cups Continue icing Try the meloxicam 15 mg daily x 1 week - then take as needed after that - do not take any additional ibuprofen while on this  If no improvement, we will get you to sports medicine - just let us know  Take care,  Jarold Motto PA-C

## 2022-12-20 ENCOUNTER — Ambulatory Visit: Payer: BC Managed Care – PPO | Admitting: Physician Assistant

## 2023-02-05 ENCOUNTER — Ambulatory Visit: Payer: BC Managed Care – PPO | Admitting: Cardiology

## 2023-02-06 ENCOUNTER — Other Ambulatory Visit: Payer: Self-pay | Admitting: Family Medicine

## 2023-02-06 DIAGNOSIS — N926 Irregular menstruation, unspecified: Secondary | ICD-10-CM

## 2023-02-17 ENCOUNTER — Other Ambulatory Visit: Payer: Self-pay | Admitting: Cardiology

## 2023-02-19 ENCOUNTER — Other Ambulatory Visit (HOSPITAL_BASED_OUTPATIENT_CLINIC_OR_DEPARTMENT_OTHER): Payer: Self-pay

## 2023-03-05 ENCOUNTER — Ambulatory Visit: Payer: BC Managed Care – PPO | Admitting: Family Medicine

## 2023-03-05 VITALS — BP 108/70 | HR 61 | Temp 97.9°F | Ht 68.0 in | Wt 252.2 lb

## 2023-03-05 DIAGNOSIS — M25561 Pain in right knee: Secondary | ICD-10-CM | POA: Diagnosis not present

## 2023-03-05 DIAGNOSIS — Z23 Encounter for immunization: Secondary | ICD-10-CM

## 2023-03-05 DIAGNOSIS — M722 Plantar fascial fibromatosis: Secondary | ICD-10-CM

## 2023-03-05 MED ORDER — DICLOFENAC SODIUM 75 MG PO TBEC: 75.0000 mg | DELAYED_RELEASE_TABLET | Freq: Two times a day (BID) | ORAL | 0 refills | Status: DC

## 2023-03-05 NOTE — Progress Notes (Signed)
Subjective  CC:  Chief Complaint  Patient presents with   Knee Pain   Foot Pain    Ongoing Heel Pain in left foot, right knee pain    HPI: [name] is a 52 y.o. female who presents to the office today to address the problems listed above in the chief complaint. April: dxd with plantar fasciitis on left foot after walking program. Still painful in spite of conservative mgt. And now lateral right knee pain with walking. No swelling or trauma.    Assessment  1. Plantar fasciitis of left foot   2. Lateral knee pain, right   3. Encounter for immunization      Plan  Subacute/chronic plantar fasciitis and secondary right knee pain:  refer to SM. Voltaren bid.   Follow up: cpe in April 2025 Visit date not found  Orders Placed This Encounter  Procedures   Flu vaccine trivalent PF, 6mos and older(Flulaval,Afluria,Fluarix,Fluzone)   Ambulatory referral to Sports Medicine   Meds ordered this encounter  Medications   diclofenac (VOLTAREN) 75 MG EC tablet    Sig: Take 1 tablet (75 mg total) by mouth 2 (two) times daily.    Dispense:  30 tablet    Refill:  0      I reviewed the patients updated PMH, FH, and SocHx.    Patient Active Problem List   Diagnosis Date Noted   Family history of early CAD 03/17/2020    Priority: High   Mixed hyperlipidemia 03/17/2020    Priority: High   Chronic idiopathic pericarditis 05/13/2017    Priority: High   Obesity (BMI 30-39.9) 05/13/2017    Priority: High   Colon polyp 10/12/2020    Priority: Medium    Breast fibroadenoma, left 08/12/2019    Priority: Medium    Oral contraceptive pill surveillance 08/04/2019    Priority: Medium    PSVT (paroxysmal supraventricular tachycardia) 05/13/2017    Priority: Medium    Pulmonary nodule less than 6 mm in diameter with low risk for malignant neoplasm 08/15/2021   Current Meds  Medication Sig   diclofenac (VOLTAREN) 75 MG EC tablet Take 1 tablet (75 mg total) by mouth 2 (two) times  daily.   diltiazem (CARDIZEM CD) 300 MG 24 hr capsule TAKE 1 CAPSULE BY MOUTH EVERY DAY   diltiazem (CARDIZEM) 30 MG tablet Take one tablet by mouth every 6 hours as needed for palpitations.   norgestimate-ethinyl estradiol (ORTHO-CYCLEN) 0.25-35 MG-MCG tablet TAKE 1 TABLET BY MOUTH EVERY DAY   rosuvastatin (CRESTOR) 10 MG tablet Take 1 tablet (10 mg total) by mouth daily.   [DISCONTINUED] meloxicam (MOBIC) 15 MG tablet Take 1 tablet daily x 1 week then take as needed    Allergies: Patient has No Known Allergies. Family History: Patient family history includes Arthritis in her mother; Diabetes in her father; Early death in her father; Heart attack (age of onset: 60) in her father; Heart disease in her father; High Cholesterol in her father; Lung cancer in her paternal grandmother; Skin cancer in her maternal grandfather; Thyroid disease in her mother. Social History:  Patient  reports that she has never smoked. She has never been exposed to tobacco smoke. She has never used smokeless tobacco. She reports current alcohol use of about 1.0 standard drink of alcohol per week. She reports that she does not use drugs.  Review of Systems: Constitutional: Negative for fever malaise or anorexia Cardiovascular: negative for chest pain Respiratory: negative for SOB or persistent cough Gastrointestinal:  negative for abdominal pain  Objective  Vitals: BP 108/70   Pulse 61   Temp 97.9 F (36.6 C)   Ht 5\' 8"  (1.727 m)   Wt 252 lb 3.2 oz (114.4 kg)   SpO2 98%   BMI 38.35 kg/m  General: no acute distress , A&Ox3 Left heel: mildly tender, no posterior ttp Right knee: nl exam except mild ttp lateral gastroc tendon  Commons side effects, risks, benefits, and alternatives for medications and treatment plan prescribed today were discussed, and the patient expressed understanding of the given instructions. Patient is instructed to call or message via MyChart if he/she has any questions or concerns  regarding our treatment plan. No barriers to understanding were identified. We discussed Red Flag symptoms and signs in detail. Patient expressed understanding regarding what to do in case of urgent or emergency type symptoms.  Medication list was reconciled, printed and provided to the patient in AVS. Patient instructions and summary information was reviewed with the patient as documented in the AVS. This note was prepared with assistance of Dragon voice recognition software. Occasional wrong-word or sound-a-like substitutions may have occurred due to the inherent limitations of voice recognition software

## 2023-03-08 NOTE — Progress Notes (Unsigned)
    Aleen Sells D.[facility] [address] [place] [address] Phone: [phone]   Assessment and Plan:     There are no diagnoses linked to this encounter.  ***   Pertinent previous records reviewed include ***   Follow Up: ***     Subjective:   I, Manuel Lawhead, am serving as a Neurosurgeon for Doctor Richardean Sale  Chief Complaint: left foot and right knee pain   HPI:   03/11/2023 Patient is a 52 year old female complaining of left foot and right knee pain. Patient states  Relevant Historical Information: ***  Additional pertinent review of systems negative.   Current Outpatient Medications:    diclofenac (VOLTAREN) 75 MG EC tablet, Take 1 tablet (75 mg total) by mouth 2 (two) times daily., Disp: 30 tablet, Rfl: 0   diltiazem (CARDIZEM CD) 300 MG 24 hr capsule, TAKE 1 CAPSULE BY MOUTH EVERY DAY, Disp: 90 capsule, Rfl: 1   diltiazem (CARDIZEM) 30 MG tablet, Take one tablet by mouth every 6 hours as needed for palpitations., Disp: 30 tablet, Rfl: 3   norgestimate-ethinyl estradiol (ORTHO-CYCLEN) 0.25-35 MG-MCG tablet, TAKE 1 TABLET BY MOUTH EVERY DAY, Disp: 84 tablet, Rfl: 0   rosuvastatin (CRESTOR) 10 MG tablet, Take 1 tablet (10 mg total) by mouth daily., Disp: 90 tablet, Rfl: 3   Objective:     There were no vitals filed for this visit.    There is no height or weight on file to calculate BMI.    Physical Exam:    ***   Electronically signed by:  Aleen Sells [facility] 7:33 AM 03/08/23

## 2023-03-11 ENCOUNTER — Ambulatory Visit: Payer: BC Managed Care – PPO | Admitting: Sports Medicine

## 2023-03-11 VITALS — BP 118/80 | HR 78 | Ht 68.0 in | Wt 252.0 lb

## 2023-03-11 DIAGNOSIS — M722 Plantar fascial fibromatosis: Secondary | ICD-10-CM

## 2023-03-11 NOTE — Patient Instructions (Signed)
Foot HEP  3-4 week follow up

## 2023-03-29 NOTE — Progress Notes (Unsigned)
    Aleen Sells D.[facility] [address] [place] [address] Phone: [phone]   Assessment and Plan:     There are no diagnoses linked to this encounter.  ***   Pertinent previous records reviewed include ***   Follow Up: ***     Subjective:   I, Madhavi Hamblen, am serving as a Neurosurgeon for Doctor Richardean Sale   Chief Complaint: left foot and right knee and thumb pain    HPI:    03/11/2023 Patient is a 52 year old female complaining of left foot and right knee pain. Patient states she has a bump on the thumb that has been there since march or April it was painful but pain is gone and the bump has moved. She is a professor.    Left heel pain she did a walking wellness challenge back in may . She was on meloxicam and that didn't help. The pain comes and goes diclofenac has helped a little the pain is intermittent.    Right knee thinks it came from antalgic gait from the left heel . Lateral/posterior knee pain   04/01/2023 Patient states   Relevant Historical Information: None pertinent  Additional pertinent review of systems negative.   Current Outpatient Medications:    diclofenac (VOLTAREN) 75 MG EC tablet, Take 1 tablet (75 mg total) by mouth 2 (two) times daily., Disp: 30 tablet, Rfl: 0   diltiazem (CARDIZEM CD) 300 MG 24 hr capsule, TAKE 1 CAPSULE BY MOUTH EVERY DAY, Disp: 90 capsule, Rfl: 1   diltiazem (CARDIZEM) 30 MG tablet, Take one tablet by mouth every 6 hours as needed for palpitations., Disp: 30 tablet, Rfl: 3   norgestimate-ethinyl estradiol (ORTHO-CYCLEN) 0.25-35 MG-MCG tablet, TAKE 1 TABLET BY MOUTH EVERY DAY, Disp: 84 tablet, Rfl: 0   rosuvastatin (CRESTOR) 10 MG tablet, Take 1 tablet (10 mg total) by mouth daily., Disp: 90 tablet, Rfl: 3   Objective:     There were no vitals filed for this visit.    There is no height or weight on file to calculate BMI.    Physical Exam:    ***   Electronically signed  by:  Aleen Sells [facility] 1:42 PM 03/29/23

## 2023-04-01 ENCOUNTER — Ambulatory Visit: Payer: BC Managed Care – PPO | Admitting: Sports Medicine

## 2023-04-01 VITALS — HR 73 | Ht 68.0 in | Wt 250.0 lb

## 2023-04-01 DIAGNOSIS — M722 Plantar fascial fibromatosis: Secondary | ICD-10-CM | POA: Diagnosis not present

## 2023-04-01 NOTE — Patient Instructions (Signed)
Ankle HEP  Boot for 1 month

## 2023-04-14 ENCOUNTER — Encounter (HOSPITAL_BASED_OUTPATIENT_CLINIC_OR_DEPARTMENT_OTHER): Payer: Self-pay

## 2023-04-14 ENCOUNTER — Emergency Department (HOSPITAL_BASED_OUTPATIENT_CLINIC_OR_DEPARTMENT_OTHER)
Admission: EM | Admit: 2023-04-14 | Discharge: 2023-04-14 | Disposition: A | Discharge status: Home / Self Care | Payer: BC Managed Care – PPO | Attending: Emergency Medicine | Admitting: Emergency Medicine

## 2023-04-14 ENCOUNTER — Emergency Department (HOSPITAL_BASED_OUTPATIENT_CLINIC_OR_DEPARTMENT_OTHER): Payer: BC Managed Care – PPO

## 2023-04-14 DIAGNOSIS — R700 Elevated erythrocyte sedimentation rate: Secondary | ICD-10-CM | POA: Insufficient documentation

## 2023-04-14 DIAGNOSIS — M25561 Pain in right knee: Secondary | ICD-10-CM | POA: Diagnosis not present

## 2023-04-14 DIAGNOSIS — D72829 Elevated white blood cell count, unspecified: Secondary | ICD-10-CM | POA: Insufficient documentation

## 2023-04-14 LAB — CBC WITH DIFFERENTIAL/PLATELET
Abs Immature Granulocytes: 0.04 10*3/uL (ref 0.00–0.07)
Basophils Absolute: 0.1 10*3/uL (ref 0.0–0.1)
Basophils Relative: 1 %
Eosinophils Absolute: 0.2 10*3/uL (ref 0.0–0.5)
Eosinophils Relative: 1 %
HCT: 45.4 % (ref 36.0–46.0)
Hemoglobin: 15.1 g/dL — ABNORMAL HIGH (ref 12.0–15.0)
Immature Granulocytes: 0 %
Lymphocytes Relative: 13 %
Lymphs Abs: 1.5 10*3/uL (ref 0.7–4.0)
MCH: 27.7 pg (ref 26.0–34.0)
MCHC: 33.3 g/dL (ref 30.0–36.0)
MCV: 83.2 fL (ref 80.0–100.0)
Monocytes Absolute: 0.6 10*3/uL (ref 0.1–1.0)
Monocytes Relative: 5 %
Neutro Abs: 9.6 10*3/uL — ABNORMAL HIGH (ref 1.7–7.7)
Neutrophils Relative %: 80 %
Platelets: 331 10*3/uL (ref 150–400)
RBC: 5.46 MIL/uL — ABNORMAL HIGH (ref 3.87–5.11)
RDW: 12.6 % (ref 11.5–15.5)
WBC: 12 10*3/uL — ABNORMAL HIGH (ref 4.0–10.5)
nRBC: 0 % (ref 0.0–0.2)

## 2023-04-14 LAB — BASIC METABOLIC PANEL
Anion gap: 8 (ref 5–15)
BUN: 11 mg/dL (ref 6–20)
CO2: 24 mmol/L (ref 22–32)
Calcium: 9.7 mg/dL (ref 8.9–10.3)
Chloride: 103 mmol/L (ref 98–111)
Creatinine, Ser: 0.67 mg/dL (ref 0.44–1.00)
GFR, Estimated: >60 mL/min (ref >60)
Glucose, Bld: 84 mg/dL (ref 70–99)
Potassium: 4.4 mmol/L (ref 3.5–5.1)
Sodium: 135 mmol/L (ref 135–145)

## 2023-04-14 LAB — SYNOVIAL CELL COUNT + DIFF, W/ CRYSTALS
Crystals, Fluid: NONE SEEN
Eosinophils-Synovial: 0 % (ref 0–1)
Lymphocytes-Synovial Fld: 15 % (ref 0–20)
Monocyte-Macrophage-Synovial Fluid: 12 % — ABNORMAL LOW (ref 50–90)
Neutrophil, Synovial: 73 % — ABNORMAL HIGH (ref 0–25)
WBC, Synovial: 2350 /mm3 — ABNORMAL HIGH (ref 0–200)

## 2023-04-14 LAB — SEDIMENTATION RATE: Sed Rate: 28 mm/h — ABNORMAL HIGH (ref 0–22)

## 2023-04-14 MED ORDER — OXYCODONE HCL 5 MG PO TABS
5.0000 mg | ORAL_TABLET | Freq: Once | ORAL | Status: AC
  Administered 2023-04-14: 5 mg via ORAL
  Filled 2023-04-14: qty 1

## 2023-04-14 MED ORDER — LIDOCAINE-EPINEPHRINE (PF) 2 %-1:200000 IJ SOLN
20.0000 mL | Freq: Once | INTRAMUSCULAR | Status: AC
  Administered 2023-04-14: 20 mL

## 2023-04-14 MED ORDER — LIDOCAINE-EPINEPHRINE 2 %-1:100000 IJ SOLN
20.0000 mL | Freq: Once | INTRAMUSCULAR | Status: DC
  Filled 2023-04-14: qty 20.4

## 2023-04-14 MED ORDER — IBUPROFEN 400 MG PO TABS
600.0000 mg | ORAL_TABLET | Freq: Once | ORAL | Status: AC
  Administered 2023-04-14: 600 mg via ORAL
  Filled 2023-04-14: qty 1

## 2023-04-14 MED ORDER — ACETAMINOPHEN 500 MG PO TABS
1000.0000 mg | ORAL_TABLET | Freq: Once | ORAL | Status: AC
  Administered 2023-04-14: 1000 mg via ORAL
  Filled 2023-04-14: qty 2

## 2023-04-14 NOTE — Discharge Instructions (Signed)
You were seen today for knee pain.  Your lab work was overall reassuring for infection but we do want you to follow-up with Dr. Ave Filter with orthopedics later this week.  I recommend you call his office tomorrow to schedule follow-up within the next couple days.  We will call you if there is an abnormality of your culture results.  If you develop fever, severe pain or any other new concerning symptoms you should return to the ED.

## 2023-04-14 NOTE — ED Triage Notes (Signed)
She c/o non-traumatic right knee pain "for quite a while now--but it's a lot worse these past two days".

## 2023-04-14 NOTE — ED Provider Notes (Signed)
[facility] AT [facility] Provider Note   CSN: [id number] Arrival date & time: 04/14/23  7829     History  Chief Complaint  Patient presents with   Knee Pain    Dorothe Breau is a 52 y.o. female.   Knee Pain 52 year old female presenting for right knee pain.  She states she has been wearing a boot for some time for plantar fasciitis of her left foot.  From using the boot she has developed some pain to her right knee.  Is been going on for several weeks.  Had improved over the last week the last few days has been getting worse and made it difficult to sleep.  Pain is over the anterior aspect of her knee as well as the right lateral aspect over her hamstring on the right.  She is able to bear weight but is more difficult.  She been trying Motrin and ice without significant relief.  No fevers or chills.  No significant redness or swelling.  No history of DVT or PE or recent immobilization.     Home Medications Prior to Admission medications   Medication Sig Start Date End Date Taking? Authorizing Provider  diclofenac (VOLTAREN) 75 MG EC tablet Take 1 tablet (75 mg total) by mouth 2 (two) times daily. 03/05/23   Willow Ora, MD  diltiazem (CARDIZEM CD) 300 MG 24 hr capsule TAKE 1 CAPSULE BY MOUTH EVERY DAY 02/19/23   Camnitz, Andree Coss, MD  diltiazem (CARDIZEM) 30 MG tablet Take one tablet by mouth every 6 hours as needed for palpitations. 08/17/22   Camnitz, Andree Coss, MD  norgestimate-ethinyl estradiol (ORTHO-CYCLEN) 0.25-35 MG-MCG tablet TAKE 1 TABLET BY MOUTH EVERY DAY 02/06/23   Willow Ora, MD  rosuvastatin (CRESTOR) 10 MG tablet Take 1 tablet (10 mg total) by mouth daily. 09/07/22   Willow Ora, MD      Allergies    Patient has no known allergies.    Review of Systems   Review of Systems Review of systems completed and notable as per HPI.  ROS otherwise negative.   Physical Exam Updated Vital Signs BP (!) 126/103 (BP Location:  Right Arm)   Pulse (!) 59   Temp 98.6 F (37 C) (Oral)   Resp 17   LMP 03/21/2023 (Approximate)   SpO2 100%  Physical Exam Vitals and nursing note reviewed.  Constitutional:      General: She is not in acute distress.    Appearance: She is well-developed.  HENT:     Head: Normocephalic and atraumatic.  Eyes:     Conjunctiva/sclera: Conjunctivae normal.  Cardiovascular:     Rate and Rhythm: Normal rate and regular rhythm.     Heart sounds: No murmur heard. Pulmonary:     Effort: Pulmonary effort is normal. No respiratory distress.     Breath sounds: Normal breath sounds.  Abdominal:     Palpations: Abdomen is soft.     Tenderness: There is no abdominal tenderness.  Musculoskeletal:        General: No swelling.     Cervical back: Neck supple.     Right lower leg: No edema.     Left lower leg: No edema.     Comments: Mild tenderness over the right lateral hamstring as well as anterior knee.  She is no warmth, erythema, or swelling around the knee.  No edema of the bilateral lower extremities.  Palpable DP and PT pulse.  She is full passive range  of motion of the knee although has some pain with this, nearly full active range of motion limited by pain.  She has good strength at the hip, knee, ankle.  Normal sensation.  Skin:    General: Skin is warm and dry.     Capillary Refill: Capillary refill takes less than 2 seconds.  Neurological:     Mental Status: She is alert.  Psychiatric:        Mood and Affect: Mood normal.     ED Results / Procedures / Treatments   Labs (all labs ordered are listed, but only abnormal results are displayed) Labs Reviewed  SYNOVIAL CELL COUNT + DIFF, W/ CRYSTALS - Abnormal; Notable for the following components:      Result Value   Appearance-Synovial CLOUDY (*)    WBC, Synovial 2,350 (*)    Neutrophil, Synovial 73 (*)    Monocyte-Macrophage-Synovial Fluid 12 (*)    All other components within normal limits  CBC WITH DIFFERENTIAL/PLATELET -  Abnormal; Notable for the following components:   WBC 12.0 (*)    RBC 5.46 (*)    Hemoglobin 15.1 (*)    Neutro Abs 9.6 (*)    All other components within normal limits  SEDIMENTATION RATE - Abnormal; Notable for the following components:   Sed Rate 28 (*)    All other components within normal limits  BODY FLUID CULTURE W GRAM STAIN  BASIC METABOLIC PANEL  GLUCOSE, BODY FLUID OTHER            PROTEIN, BODY FLUID (OTHER)  C-REACTIVE PROTEIN    EKG None  Radiology DG Knee Complete 4 Views Right  Result Date: 04/14/2023 CLINICAL DATA:  52 year old female with history of right-sided knee pain. EXAM: RIGHT KNEE - COMPLETE 4+ VIEW COMPARISON:  No priors. FINDINGS: Four views of the right knee demonstrate no acute displaced fracture, subluxation or dislocation. Mild joint space narrowing and subchondral sclerosis noted in the knee joint, most severe in the lateral and patellofemoral compartments, indicative of osteoarthritis. IMPRESSION: 1. No acute radiographic abnormality of the right knee. Electronically Signed   By: Trudie Reed M.D.   On: 04/14/2023 08:42    Procedures Procedures    Medications Ordered in ED Medications  acetaminophen (TYLENOL) tablet 1,000 mg (1,000 mg Oral Given 04/14/23 0733)  ibuprofen (ADVIL) tablet 600 mg (600 mg Oral Given 04/14/23 0733)  oxyCODONE (Oxy IR/ROXICODONE) immediate release tablet 5 mg (5 mg Oral Given 04/14/23 0733)  lidocaine-EPINEPHrine (XYLOCAINE W/EPI) 2 %-1:200000 (PF) injection 20 mL (20 mLs Other Given by Other 04/14/23 1023)    ED Course/ Medical Decision Making/ A&P Clinical Course as of 04/14/23 1858  Sun Apr 14, 2023  1013 On reassessment she is having worsening pain even after pain medicine.  She feels like she is having worsening swelling over the anterior knee.  Slightly warm to touch as well.  No skin changes.  Will plan for arthrocentesis.  Consent obtained verbally from patient.  Performed without difficulty.   Ultrasound-guided, shows significant effusion. [JD]  1430 [address]: follow up in clinic this week, follow cultures [JD]    Clinical Course User Index [JD] Laurence Spates, MD                                 Medical Decision Making Amount and/or Complexity of Data Reviewed Labs: ordered. Radiology: ordered.  Risk OTC drugs. Prescription drug management.   Medical Decision Making:  Tempie Gibeault is a 52 y.o. female who presented to the ED today with right knee pain.  Vital signs reviewed.  On exam she is well-appearing, she has had pain for couple weeks since wearing a boot for her left plantar fasciitis.  However worsened over the last couple days.  Exam shows some mild tenderness anteriorly, but no swelling around the knee, erythema, warmth.  She has had no fevers, I have low suspicion for septic arthritis or osteomyelitis at this time.  She has not had any trauma as far she is aware, will obtain knee x-ray for further evaluation.  Suspect this could be muscular or related to change in gait from her plantar fasciitis given she has been using a boot and has had right knee pain for couple weeks similar to this although now worsened.   Patient placed on continuous vitals and telemetry monitoring while in ED which was reviewed periodically.  Reviewed and confirmed nursing documentation for past medical history, family history, social history.  On reassessment she is having worsening pain even after pain medicine.  She feels like she is having worsening swelling over the anterior knee.  Slightly warm to touch as well.  No skin changes.  Will plan for arthrocentesis.  Consent obtained verbally from patient.  Performed without difficulty.  Ultrasound-guided, shows significant effusion.  Blood work reviewed notable for leukocytosis, elevated ESR.  Synovial cell count notable for neutral predominant just over 2000.  Gram stain is negative.  I did discuss with Dr. Ave Filter with orthopedics,  suspect very unlikely to be septic arthritis based on cell count.  I recommend she follow-up in his office later this week and he will help follow-up with cultures.  I reevaluated the patient, she is feeling better after arthrocentesis.  More likely inflammatory arthritis rather than infectious.  I gave her the phone number for the orthopedics office and recommend she call tomorrow schedule follow-up.  Given strict return precautions for fever, skin changes or severe pain or other concerning symptoms.  She was discharged in stable condition.    Patient's presentation is most consistent with acute complicated illness / injury requiring diagnostic workup.           Final Clinical Impression(s) / ED Diagnoses Final diagnoses:  Acute pain of right knee    Rx / DC Orders ED Discharge Orders     None         Laurence Spates, MD 04/14/23 1858

## 2023-04-15 DIAGNOSIS — S83281A Other tear of lateral meniscus, current injury, right knee, initial encounter: Secondary | ICD-10-CM | POA: Diagnosis not present

## 2023-04-15 LAB — C-REACTIVE PROTEIN: CRP: 2.7 mg/dL — ABNORMAL HIGH (ref <1.0)

## 2023-04-15 LAB — PROTEIN, BODY FLUID (OTHER): Total Protein, Body Fluid Other: 4.3 g/dL

## 2023-04-15 LAB — GLUCOSE, BODY FLUID OTHER: Glucose, Body Fluid Other: 37 mg/dL

## 2023-04-16 NOTE — Progress Notes (Signed)
[name] D.[name] [address] [place] [address] Phone: [phone]   Assessment and Plan:     1. Plantar fasciitis, left 2. Left foot pain -Chronic with exacerbation, subsequent visit - No improvement despite 4 weeks of boot use, plantar fascial CSI performed on 03/11/2023.  Still most consistent with plantar fasciitis based on HPI and physical exam, however since patient is not improving with typical treatment course, recommend further evaluation with MRI to rule out other conditions such as fat pad syndrome versus calcaneal stress reaction/fracture - Discontinue boot use - Recommend using cushioned tennis shoes and inserts.  Can find inserts at [facility] feet or other similar store - May continue to use meloxicam/Tylenol as needed for pain relief  3. Acute pain of right knee  -Acute, uncomplicated, initial visit - Suspect flare of right knee pain due to compensation with boot use - Discontinue boot use which should improve gait and decrease knee pain - Reviewed unremarkable x-ray and cultures with no growth after aspiration and ER visit  Pertinent previous records reviewed include ER note 04/14/2023, right knee x-ray 04/14/2023, fluid culture 04/14/2023   Follow Up: 5 days after MRI to review results and discuss treatment plan   Subjective:   I, [name], am serving as a Neurosurgeon for Doctor [employer]   Chief Complaint: left foot and right knee and thumb pain    HPI:    03/11/2023 Patient is a 52 year old female complaining of left foot and right knee pain. Patient states she has a bump on the thumb that has been there since march or April it was painful but pain is gone and the bump has moved. She is a professor.    Left heel pain she did a walking wellness challenge back in may . She was on meloxicam and that didn't help. The pain comes and goes diclofenac has helped a little the pain is intermittent.    Right knee  thinks it came from antalgic gait from the left heel . Lateral/posterior knee pain    04/01/2023 Patient states she is the same . She has good and bad days with her heel and right knee . She states she has antalgic gait . Advil helps with the pain   04/29/2023 Patient states that boot didn't help much, right knee pain went to ED. Left knee pain was seen by ortho and given a CSI    Relevant Historical Information: None pertinent  Additional pertinent review of systems negative.   Current Outpatient Medications:    diclofenac (VOLTAREN) 75 MG EC tablet, Take 1 tablet (75 mg total) by mouth 2 (two) times daily., Disp: 30 tablet, Rfl: 0   diltiazem (CARDIZEM CD) 300 MG 24 hr capsule, TAKE 1 CAPSULE BY MOUTH EVERY DAY, Disp: 90 capsule, Rfl: 1   diltiazem (CARDIZEM) 30 MG tablet, Take one tablet by mouth every 6 hours as needed for palpitations., Disp: 30 tablet, Rfl: 3   norgestimate-ethinyl estradiol (ORTHO-CYCLEN) 0.25-35 MG-MCG tablet, TAKE 1 TABLET BY MOUTH EVERY DAY, Disp: 84 tablet, Rfl: 0   rosuvastatin (CRESTOR) 10 MG tablet, Take 1 tablet (10 mg total) by mouth daily., Disp: 90 tablet, Rfl: 3   Objective:     Vitals:   04/29/23 1306  Pulse: 95  SpO2: 99%  Weight: 248 lb (112.5 kg)  Height: 5\' 8"  (1.727 m)      Body mass index is 37.71 kg/m.    Physical Exam:  Gen: Appears well, nad, nontoxic and pleasant Psych: Alert and oriented, appropriate mood and affect Neuro: sensation intact, strength is 5/5 with df/pf/inv/ev, muscle tone wnl Skin: no susupicious lesions or rashes   Left foot/ankle:  No deformity, no swelling or effusion NTTP over fibular head, lat mal, medial mal, achilles, navicular, base of 5th, ATFL, CFL, deltoid, calcaneous or midfoot ROM DF 30, PF 45, inv/ev intact Negative ant drawer, talar tilt, rotation test, squeeze test. Neg thompson No pain with resisted inversion or eversion  TTP anterior plantar surface of calcaneus at plantar fascia origin,  medial plantar fascial band    Electronically signed by:  [name] D.[name] 1:26 PM 04/29/23

## 2023-04-17 LAB — BODY FLUID CULTURE W GRAM STAIN: Gram Stain: NONE SEEN

## 2023-04-18 DIAGNOSIS — M1712 Unilateral primary osteoarthritis, left knee: Secondary | ICD-10-CM | POA: Diagnosis not present

## 2023-04-18 DIAGNOSIS — M25562 Pain in left knee: Secondary | ICD-10-CM | POA: Diagnosis not present

## 2023-04-25 DIAGNOSIS — M25561 Pain in right knee: Secondary | ICD-10-CM | POA: Diagnosis not present

## 2023-04-29 ENCOUNTER — Ambulatory Visit: Payer: BC Managed Care – PPO | Admitting: Sports Medicine

## 2023-04-29 VITALS — HR 95 | Ht 68.0 in | Wt 248.0 lb

## 2023-04-29 DIAGNOSIS — M79672 Pain in left foot: Secondary | ICD-10-CM

## 2023-04-29 DIAGNOSIS — M722 Plantar fascial fibromatosis: Secondary | ICD-10-CM | POA: Diagnosis not present

## 2023-04-29 DIAGNOSIS — M25561 Pain in right knee: Secondary | ICD-10-CM | POA: Diagnosis not present

## 2023-04-29 NOTE — Patient Instructions (Addendum)
[facility] [place] Discontinue boot use Recommend using cushion inserts and shoes Can find inserts at [employer] or other similar store Follow up 5 days after MRI

## 2023-05-01 ENCOUNTER — Ambulatory Visit: Payer: BC Managed Care – PPO | Admitting: Cardiology

## 2023-05-01 NOTE — Addendum Note (Signed)
Addended by: Evon Slack on: 05/01/2023 07:53 AM   Modules accepted: Orders

## 2023-05-07 DIAGNOSIS — M1711 Unilateral primary osteoarthritis, right knee: Secondary | ICD-10-CM | POA: Diagnosis not present

## 2023-05-07 DIAGNOSIS — S83281A Other tear of lateral meniscus, current injury, right knee, initial encounter: Secondary | ICD-10-CM | POA: Diagnosis not present

## 2023-05-08 ENCOUNTER — Other Ambulatory Visit: Payer: Self-pay | Admitting: Family Medicine

## 2023-05-08 DIAGNOSIS — N926 Irregular menstruation, unspecified: Secondary | ICD-10-CM

## 2023-05-10 ENCOUNTER — Ambulatory Visit
Admission: RE | Admit: 2023-05-10 | Discharge: 2023-05-10 | Disposition: A | Discharge status: Home / Self Care | Payer: BC Managed Care – PPO | Source: Ambulatory Visit | Attending: Sports Medicine | Admitting: Sports Medicine

## 2023-05-10 DIAGNOSIS — M79672 Pain in left foot: Secondary | ICD-10-CM

## 2023-05-10 DIAGNOSIS — M722 Plantar fascial fibromatosis: Secondary | ICD-10-CM

## 2023-05-10 DIAGNOSIS — G8929 Other chronic pain: Secondary | ICD-10-CM | POA: Diagnosis not present

## 2023-05-10 DIAGNOSIS — M7732 Calcaneal spur, left foot: Secondary | ICD-10-CM | POA: Diagnosis not present

## 2023-05-15 ENCOUNTER — Ambulatory Visit: Payer: BC Managed Care – PPO | Attending: Cardiology | Admitting: Pulmonary Disease

## 2023-05-15 ENCOUNTER — Encounter: Payer: Self-pay | Admitting: Pulmonary Disease

## 2023-05-15 VITALS — BP 100/70 | HR 66 | Ht 68.0 in | Wt 250.8 lb

## 2023-05-15 DIAGNOSIS — I471 Supraventricular tachycardia, unspecified: Secondary | ICD-10-CM

## 2023-05-15 NOTE — Patient Instructions (Signed)
Medication Instructions:  Your physician recommends that you continue on your current medications as directed. Please refer to the Current Medication list given to you today.  *If you need a refill on your cardiac medications before your next appointment, please call your pharmacy*  Lab Work: None ordered If you have labs (blood work) drawn today and your tests are completely normal, you will receive your results only by: MyChart Message (if you have MyChart) OR A paper copy in the mail If you have any lab test that is abnormal or we need to change your treatment, we will call you to review the results.  Follow-Up: At [facility], you and your health needs are our priority.  As part of our continuing mission to provide you with exceptional heart care, we have created designated Provider Care Teams.  These Care Teams include your primary Cardiologist (physician) and Advanced Practice Providers (APPs -  Physician Assistants and Nurse Practitioners) who all work together to provide you with the care you need, when you need it.  Your next appointment:   Next available  Provider:   Loman Brooklyn, MD Only to discuss ablation

## 2023-05-15 NOTE — Progress Notes (Signed)
Electrophysiology Office Note:   Date:  05/15/2023  ID:  [name], DOB 1971/01/12, MRN [id number]  Primary Cardiologist: None Electrophysiologist: Will Jorja Loa, MD      History of Present Illness:   [name] is a 52 y.o. female with h/o [name], recurrent idiopathic pericarditis (initially dx in 2008 in [place]) s/p pericardial window seen today for routine electrophysiology followup.   Last seen in EP Clinic 08/2022 after an episode at an ball game in [place] with her daughter and she had to go to the ER.  where she went into [name].  She was treated with IV diltiazem by EMS and converted back to SR.  She thought she potentially had missed a dose of diltiazem that day of the office visit. She had no further episodes after while on medication. Ablation was discussed at visit with Dr. Elberta Fortis but she wanted to see how she did on higher dose of diltiazem.    Since last being seen in our clinic the patient reports she has noted more anxiety surrounding her [name].  She worries about having an episode when she is driving as she has a 20 year daughter.  She has approximately 1 episode per week, lasting approximately 2 minutes at a time. She feels she has longer episodes at night that may last up to 13 minutes. On 10/19, she had two "long" episodes of 8 minutes and then 13 minutes. She recently spoke to a friend who had an ablation and is reconsidering moving forward with [name] ablation.   She denies chest pain, palpitations, dyspnea, PND, orthopnea, nausea, vomiting, dizziness, syncope, edema, weight gain, or early satiety.   Review of systems complete and found to be negative unless listed in HPI.   EP Information / Studies Reviewed:    EKG is ordered today. Personal review as below.  EKG Interpretation Date/Time:  Wednesday May 15 2023 15:22:20 EST Ventricular Rate:  66 PR Interval:  178 QRS Duration:  76 QT Interval:  416 QTC Calculation: 436 R Axis:   59  Text  Interpretation: Normal sinus rhythm Confirmed by Canary Brim ([id number]) on 05/15/2023 3:30:02 PM   Studies:  ECHO 10/2017 >  LVEF 55-60% 30 Day Monitor 10/2017 > SR, PVC's, several episodes of [name]   Arrhythmia / AAD [name]        Physical Exam:   VS:  BP 100/70   Pulse 66   Ht 5\' 8"  (1.727 m)   Wt 250 lb 12.8 oz (113.8 kg)   SpO2 99%   BMI 38.13 kg/m    Wt Readings from Last 3 Encounters:  05/15/23 250 lb 12.8 oz (113.8 kg)  04/29/23 248 lb (112.5 kg)  04/01/23 250 lb (113.4 kg)     GEN: Well nourished, well developed in no acute distress NECK: No JVD; No carotid bruits CARDIAC: Regular rate and rhythm, no murmurs, rubs, gallops RESPIRATORY:  Clear to auscultation without rales, wheezing or rhonchi  ABDOMEN: Soft, non-tender, non-distended EXTREMITIES:  No edema; No deformity   ASSESSMENT AND PLAN:    [name]  Suspected AVNRT -previously discussed ablation with Dr. 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  Plan to bring he back at Dr. Elberta Fortis next available appt to confirm she would like to proceed with ablation / schedule.   Hx Idiopathic Pericarditis -no  chest pain   Follow up with Dr. Elberta Fortis  in January 2025 as scheduled   Signed, Canary Brim, MSN, APRN, NP-C, AGACNP-BC [facility] - Electrophysiology  05/15/2023, 4:26 PM

## 2023-05-16 ENCOUNTER — Telehealth: Payer: Self-pay

## 2023-05-16 NOTE — Telephone Encounter (Signed)
Transition Care Management Follow-up Telephone Call Date of discharge and from where: 04/14/2023 [facility] How have you been since you were released from the hospital? Patient stated her pain level has improved. Any questions or concerns? No  Items Reviewed: Did the pt receive and understand the discharge instructions provided? Yes  Medications obtained and verified?  No medication prescribed. Other? No  Any new allergies since your discharge? No  Dietary orders reviewed? Yes Do you have support at home? Yes   Follow up appointments reviewed:  PCP Hospital f/u appt confirmed? No  Scheduled to see  on  @ . Specialist Hospital f/u appt confirmed?  Patient stated she received discharge instructions and will follow-up with Ortho if needed.  Scheduled to see  on  @ . Are transportation arrangements needed? No  If their condition worsens, is the pt aware to call PCP or go to the Emergency Dept.? Yes Was the patient provided with contact information for the PCP's office or ED? Yes Was to pt encouraged to call back with questions or concerns? Yes   [name] [facility]  [facility], [facility] Guide Direct Dial: [phone]  Website: [email or web address]

## 2023-05-20 NOTE — Progress Notes (Unsigned)
    Aleen Sells D.[facility] [address] [place] [address] Phone: [phone]   Assessment and Plan:     There are no diagnoses linked to this encounter.  ***   Pertinent previous records reviewed include ***    Follow Up: ***     Subjective:   I, Usiel Astarita, am serving as a Neurosurgeon for Doctor Richardean Sale   Chief Complaint: left foot and right knee and thumb pain    HPI:    03/11/2023 Patient is a 52 year old female complaining of left foot and right knee pain. Patient states she has a bump on the thumb that has been there since march or April it was painful but pain is gone and the bump has moved. She is a professor.    Left heel pain she did a walking wellness challenge back in may . She was on meloxicam and that didn't help. The pain comes and goes diclofenac has helped a little the pain is intermittent.    Right knee thinks it came from antalgic gait from the left heel . Lateral/posterior knee pain    04/01/2023 Patient states she is the same . She has good and bad days with her heel and right knee . She states she has antalgic gait . Advil helps with the pain    04/29/2023 Patient states that boot didn't help much, right knee pain went to ED. Left knee pain was seen by ortho and given a CSI   05/21/2023 Patient states   Relevant Historical Information: None pertinent Additional pertinent review of systems negative.   Current Outpatient Medications:    diclofenac (VOLTAREN) 75 MG EC tablet, Take 1 tablet (75 mg total) by mouth 2 (two) times daily., Disp: 30 tablet, Rfl: 0   diltiazem (CARDIZEM CD) 300 MG 24 hr capsule, TAKE 1 CAPSULE BY MOUTH EVERY DAY, Disp: 90 capsule, Rfl: 1   diltiazem (CARDIZEM) 30 MG tablet, Take one tablet by mouth every 6 hours as needed for palpitations., Disp: 30 tablet, Rfl: 3   norgestimate-ethinyl estradiol (ORTHO-CYCLEN) 0.25-35 MG-MCG tablet, TAKE 1 TABLET BY MOUTH EVERY DAY, Disp: 84  tablet, Rfl: 0   rosuvastatin (CRESTOR) 10 MG tablet, Take 1 tablet (10 mg total) by mouth daily., Disp: 90 tablet, Rfl: 3   Objective:     There were no vitals filed for this visit.    There is no height or weight on file to calculate BMI.    Physical Exam:    ***   Electronically signed by:  Aleen Sells [facility] 12:15 PM 05/20/23

## 2023-05-21 ENCOUNTER — Ambulatory Visit: Payer: BC Managed Care – PPO | Admitting: Sports Medicine

## 2023-05-21 VITALS — BP 120/82 | HR 74 | Ht 68.0 in | Wt 249.0 lb

## 2023-05-21 DIAGNOSIS — M722 Plantar fascial fibromatosis: Secondary | ICD-10-CM

## 2023-05-21 DIAGNOSIS — M79672 Pain in left foot: Secondary | ICD-10-CM | POA: Diagnosis not present

## 2023-05-21 NOTE — Patient Instructions (Signed)
PRP  Follow up for PRP plantar fascia injection

## 2023-06-29 DIAGNOSIS — R059 Cough, unspecified: Secondary | ICD-10-CM | POA: Diagnosis not present

## 2023-06-29 DIAGNOSIS — R0981 Nasal congestion: Secondary | ICD-10-CM | POA: Diagnosis not present

## 2023-06-29 DIAGNOSIS — J069 Acute upper respiratory infection, unspecified: Secondary | ICD-10-CM | POA: Diagnosis not present

## 2023-07-16 ENCOUNTER — Ambulatory Visit: Payer: BC Managed Care – PPO | Admitting: Cardiology

## 2023-08-01 ENCOUNTER — Other Ambulatory Visit: Payer: Self-pay | Admitting: Family Medicine

## 2023-08-01 DIAGNOSIS — N926 Irregular menstruation, unspecified: Secondary | ICD-10-CM

## 2023-08-13 ENCOUNTER — Telehealth: Payer: BC Managed Care – PPO | Admitting: Physician Assistant

## 2023-08-13 DIAGNOSIS — U071 COVID-19: Secondary | ICD-10-CM

## 2023-08-13 MED ORDER — ONDANSETRON 4 MG PO TBDP
4.0000 mg | ORAL_TABLET | Freq: Three times a day (TID) | ORAL | 0 refills | Status: DC | PRN
Start: 2023-08-13 — End: 2023-10-25

## 2023-08-13 MED ORDER — NIRMATRELVIR/RITONAVIR (PAXLOVID)TABLET
3.0000 | ORAL_TABLET | Freq: Two times a day (BID) | ORAL | 0 refills | Status: DC
Start: 2023-08-13 — End: 2023-08-18

## 2023-08-13 NOTE — Progress Notes (Signed)
Virtual Visit Consent   [name], you are scheduled for a virtual visit with a  provider today. Just as with appointments in the office, your consent must be obtained to participate. Your consent will be active for this visit and any virtual visit you may have with one of our providers in the next 365 days. If you have a MyChart account, a copy of this consent can be sent to you electronically.  As this is a virtual visit, video technology does not allow for your provider to perform a traditional examination. This may limit your provider's ability to fully assess your condition. If your provider identifies any concerns that need to be evaluated in person or the need to arrange testing (such as labs, EKG, etc.), we will make arrangements to do so. Although advances in technology are sophisticated, we cannot ensure that it will always work on either your end or our end. If the connection with a video visit is poor, the visit may have to be switched to a telephone visit. With either a video or telephone visit, we are not always able to ensure that we have a secure connection.  By engaging in this virtual visit, you consent to the provision of healthcare and authorize for your insurance to be billed (if applicable) for the services provided during this visit. Depending on your insurance coverage, you may receive a charge related to this service.  I need to obtain your verbal consent now. Are you willing to proceed with your visit today? [name] has provided verbal consent on 08/13/2023 for a virtual visit (video or telephone). [name], [place]  Date: 08/13/2023 2:14 PM   Virtual Visit via Video Note   I, [name], connected with  Wanell Lorenzi  ([id number], 1970/07/16) on 08/13/23 at  2:15 PM EST by a video-enabled telemedicine application and verified that I am speaking with the correct person using two identifiers.  Location: Patient: Virtual Visit  Location Patient: Home Provider: Virtual Visit Location Provider: Home Office   I discussed the limitations of evaluation and management by telemedicine and the availability of in person appointments. The patient expressed understanding and agreed to proceed.    History of Present Illness: [name] is a 53 y.o. who identifies as a female who was assigned female at birth, and is being seen today for COVID-19. Patient endorses symptoms starting Sunday with rhinorrhea. Yesterday starting to note nasal and head congestion with mild aches. As such, this morning she tested at home and positive for COVID. Daughter also positive. So far she denies noting any fever. Has taken some Advil to help aches.    HPI: HPI  Problems:  Patient Active Problem List   Diagnosis Date Noted   Pulmonary nodule less than 6 mm in diameter with low risk for malignant neoplasm 08/15/2021   Colon polyp 10/12/2020   Family history of early CAD 03/17/2020   Mixed hyperlipidemia 03/17/2020   Breast fibroadenoma, left 08/12/2019   Oral contraceptive pill surveillance 08/04/2019   Chronic idiopathic pericarditis 05/13/2017   PSVT (paroxysmal supraventricular tachycardia) (HCC) 05/13/2017   Obesity (BMI 30-39.9) 05/13/2017    Allergies: No Known Allergies Medications:  Current Outpatient Medications:    nirmatrelvir/ritonavir (PAXLOVID) 20 x 150 MG & 10 x 100MG  TABS, Take 3 tablets by mouth 2 (two) times daily for 5 days. (Take nirmatrelvir 150 mg two tablets twice daily for 5 days and ritonavir 100 mg one tablet twice daily for 5 days) Patient GFR  is > 60, Disp: 30 tablet, Rfl: 0   diltiazem (CARDIZEM CD) 300 MG 24 hr capsule, TAKE 1 CAPSULE BY MOUTH EVERY DAY, Disp: 90 capsule, Rfl: 1   diltiazem (CARDIZEM) 30 MG tablet, Take one tablet by mouth every 6 hours as needed for palpitations., Disp: 30 tablet, Rfl: 3   norgestimate-ethinyl estradiol (ORTHO-CYCLEN) 0.25-35 MG-MCG tablet, TAKE 1 TABLET BY MOUTH EVERY DAY,  Disp: 84 tablet, Rfl: 0   rosuvastatin (CRESTOR) 10 MG tablet, Take 1 tablet (10 mg total) by mouth daily., Disp: 90 tablet, Rfl: 3  Observations/Objective: Patient is well-developed, well-nourished in no acute distress.  Resting comfortably at home.  Head is normocephalic, atraumatic.  No labored breathing. Speech is clear and coherent with logical content.  Patient is alert and oriented at baseline.   Assessment and Plan: 1. COVID-19 (Primary) - nirmatrelvir/ritonavir (PAXLOVID) 20 x 150 MG & 10 x 100MG  TABS; Take 3 tablets by mouth 2 (two) times daily for 5 days. (Take nirmatrelvir 150 mg two tablets twice daily for 5 days and ritonavir 100 mg one tablet twice daily for 5 days) Patient GFR is > 60  Dispense: 30 tablet; Refill: 0  Patient with multiple risk factors for complicated course of illness. Discussed risks/benefits of antiviral medications including most common potential ADRs. Patient voiced understanding and would like to proceed with antiviral medication. They are candidate for Paxlovid. Rx sent to pharmacy. Supportive measures, OTC medications and vitamin regimen reviewed. Zofran per orders. Quarantine reviewed in detail. Strict ER precautions discussed with patient.    Follow Up Instructions: I discussed the assessment and treatment plan with the patient. The patient was provided an opportunity to ask questions and all were answered. The patient agreed with the plan and demonstrated an understanding of the instructions.  A copy of instructions were sent to the patient via MyChart unless otherwise noted below.   The patient was advised to call back or seek an in-person evaluation if the symptoms worsen or if the condition fails to improve as anticipated.    Pamela Climes, PA-C

## 2023-08-13 NOTE — Patient Instructions (Signed)
Fleur Stegmann, thank you for joining [name], PA-C for today's virtual visit.  While this provider is not your primary care provider (PCP), if your PCP is located in our provider database this encounter information will be shared with them immediately following your visit.   A [facility] MyChart account gives you access to today's visit and all your visits, tests, and labs performed at [facility] " click here if you don't have a [facility] MyChart account or go to [email or web address]  Consent: (Patient) [name] provided verbal consent for this virtual visit at the beginning of the encounter.  Current Medications:  Current Outpatient Medications:    diltiazem (CARDIZEM CD) 300 MG 24 hr capsule, TAKE 1 CAPSULE BY MOUTH EVERY DAY, Disp: 90 capsule, Rfl: 1   diltiazem (CARDIZEM) 30 MG tablet, Take one tablet by mouth every 6 hours as needed for palpitations., Disp: 30 tablet, Rfl: 3   norgestimate-ethinyl estradiol (ORTHO-CYCLEN) 0.25-35 MG-MCG tablet, TAKE 1 TABLET BY MOUTH EVERY DAY, Disp: 84 tablet, Rfl: 0   rosuvastatin (CRESTOR) 10 MG tablet, Take 1 tablet (10 mg total) by mouth daily., Disp: 90 tablet, Rfl: 3   Medications ordered in this encounter:  No orders of the defined types were placed in this encounter.    *If you need refills on other medications prior to your next appointment, please contact your pharmacy*  Follow-Up: Call back or seek an in-person evaluation if the symptoms worsen or if the condition fails to improve as anticipated.  [facility] [phone]  Care Instructions: Please keep well-hydrated and get plenty of rest. Start a saline nasal rinse to flush out your nasal passages. You can use plain Mucinex to help thin congestion. If you have a humidifier, running in the bedroom at night. I want you to start OTC vitamin D3 1000 units daily, vitamin C 1000 mg daily, and a zinc supplement. Please take  prescribed medications as directed.    Isolation Instructions: You are to isolate at home until you have been fever free for at least 24 hours without a fever-reducing medication, and symptoms have been steadily improving for 24 hours. At that time,  you can end isolation but need to mask for an additional 5 days.   If you must be around other household members who do not have symptoms, you need to make sure that both you and the family members are masking consistently with a high-quality mask.  If you note any worsening of symptoms despite treatment, please seek an in-person evaluation ASAP. If you note any significant shortness of breath or any chest pain, please seek ER evaluation. Please do not delay care!   COVID-19: What to Do if You Are Sick If you test positive and are an older adult or someone who is at high risk of getting very sick from COVID-19, treatment may be available. Contact a healthcare provider right away after a positive test to determine if you are eligible, even if your symptoms are mild right now. You can also visit a Test to Treat location and, if eligible, receive a prescription from a provider. Don't delay: Treatment must be started within the first few days to be effective. If you have a fever, cough, or other symptoms, you might have COVID-19. Most people have mild illness and are able to recover at home. If you are sick: Keep track of your symptoms. If you have an emergency warning sign (including trouble breathing), call 911. Steps to help prevent  the spread of COVID-19 if you are sick If you are sick with COVID-19 or think you might have COVID-19, follow the steps below to care for yourself and to help protect other people in your home and community. Stay home except to get medical care Stay home. Most people with COVID-19 have mild illness and can recover at home without medical care. Do not leave your home, except to get medical care. Do not visit public areas and  do not go to places where you are unable to wear a mask. Take care of yourself. Get rest and stay hydrated. Take over-the-counter medicines, such as acetaminophen, to help you feel better. Stay in touch with your doctor. Call before you get medical care. Be sure to get care if you have trouble breathing, or have any other emergency warning signs, or if you think it is an emergency. Avoid public transportation, ride-sharing, or taxis if possible. Get tested If you have symptoms of COVID-19, get tested. While waiting for test results, stay away from others, including staying apart from those living in your household. Get tested as soon as possible after your symptoms start. Treatments may be available for people with COVID-19 who are at risk for becoming very sick. Don't delay: Treatment must be started early to be effective--some treatments must begin within 5 days of your first symptoms. Contact your healthcare provider right away if your test result is positive to determine if you are eligible. Self-tests are one of several options for testing for the virus that causes COVID-19 and may be more convenient than laboratory-based tests and point-of-care tests. Ask your healthcare provider or your local health department if you need help interpreting your test results. You can visit your state, tribal, local, and territorial health department's website to look for the latest local information on testing sites. Separate yourself from other people As much as possible, stay in a specific room and away from other people and pets in your home. If possible, you should use a separate bathroom. If you need to be around other people or animals in or outside of the home, wear a well-fitting mask. Tell your close contacts that they may have been exposed to COVID-19. An infected person can spread COVID-19 starting 48 hours (or 2 days) before the person has any symptoms or tests positive. By letting your close contacts  know they may have been exposed to COVID-19, you are helping to protect everyone. See COVID-19 and Animals if you have questions about pets. If you are diagnosed with COVID-19, someone from the health department may call you. Answer the call to slow the spread. Monitor your symptoms Symptoms of COVID-19 include fever, cough, or other symptoms. Follow care instructions from your healthcare provider and local health department. Your local health authorities may give instructions on checking your symptoms and reporting information. When to seek emergency medical attention Look for emergency warning signs* for COVID-19. If someone is showing any of these signs, seek emergency medical care immediately: Trouble breathing Persistent pain or pressure in the chest New confusion Inability to wake or stay awake Pale, gray, or blue-colored skin, lips, or nail beds, depending on skin tone *This list is not all possible symptoms. Please call your medical provider for any other symptoms that are severe or concerning to you. Call 911 or call ahead to your local emergency facility: Notify the operator that you are seeking care for someone who has or may have COVID-19. Call ahead before visiting your doctor Call ahead.  Many medical visits for routine care are being postponed or done by phone or telemedicine. If you have a medical appointment that cannot be postponed, call your doctor's office, and tell them you have or may have COVID-19. This will help the office protect themselves and other patients. If you are sick, wear a well-fitting mask You should wear a mask if you must be around other people or animals, including pets (even at home). Wear a mask with the best fit, protection, and comfort for you. You don't need to wear the mask if you are alone. If you can't put on a mask (because of trouble breathing, for example), cover your coughs and sneezes in some other way. Try to stay at least 6 feet away from  other people. This will help protect the people around you. Masks should not be placed on young children under age 3 years, anyone who has trouble breathing, or anyone who is not able to remove the mask without help. Cover your coughs and sneezes Cover your mouth and nose with a tissue when you cough or sneeze. Throw away used tissues in a lined trash can. Immediately wash your hands with soap and water for at least 20 seconds. If soap and water are not available, clean your hands with an alcohol-based hand sanitizer that contains at least 60% alcohol. Clean your hands often Wash your hands often with soap and water for at least 20 seconds. This is especially important after blowing your nose, coughing, or sneezing; going to the bathroom; and before eating or preparing food. Use hand sanitizer if soap and water are not available. Use an alcohol-based hand sanitizer with at least 60% alcohol, covering all surfaces of your hands and rubbing them together until they feel dry. Soap and water are the best option, especially if hands are visibly dirty. Avoid touching your eyes, nose, and mouth with unwashed hands. Handwashing Tips Avoid sharing personal household items Do not share dishes, drinking glasses, cups, eating utensils, towels, or bedding with other people in your home. Wash these items thoroughly after using them with soap and water or put in the dishwasher. Clean surfaces in your home regularly Clean and disinfect high-touch surfaces (for example, doorknobs, tables, handles, light switches, and countertops) in your "sick room" and bathroom. In shared spaces, you should clean and disinfect surfaces and items after each use by the person who is ill. If you are sick and cannot clean, a caregiver or other person should only clean and disinfect the area around you (such as your bedroom and bathroom) on an as needed basis. Your caregiver/other person should wait as long as possible (at least several  hours) and wear a mask before entering, cleaning, and disinfecting shared spaces that you use. Clean and disinfect areas that may have blood, stool, or body fluids on them. Use household cleaners and disinfectants. Clean visible dirty surfaces with household cleaners containing soap or detergent. Then, use a household disinfectant. Use a product from [employer] List N: Disinfectants for Coronavirus (COVID-19). Be sure to follow the instructions on the label to ensure safe and effective use of the product. Many products recommend keeping the surface wet with a disinfectant for a certain period of time (look at "contact time" on the product label). You may also need to wear personal protective equipment, such as gloves, depending on the directions on the product label. Immediately after disinfecting, wash your hands with soap and water for 20 seconds. For completed guidance on cleaning and disinfecting  your home, visit Complete Disinfection Guidance. Take steps to improve ventilation at home Improve ventilation (air flow) at home to help prevent from spreading COVID-19 to other people in your household. Clear out COVID-19 virus particles in the air by opening windows, using air filters, and turning on fans in your home. Use this interactive tool to learn how to improve air flow in your home. When you can be around others after being sick with COVID-19 Deciding when you can be around others is different for different situations. Find out when you can safely end home isolation. For any additional questions about your care, contact your healthcare provider or state or local health department. 09/20/2020 Content source: [facility] ([facility]), Division of Viral Diseases This information is not intended to replace advice given to you by your health care provider. Make sure you discuss any questions you have with your health care provider. Document Revised: 11/03/2020  Document Reviewed: 11/03/2020 Elsevier Patient Education  2022 [employer].  If you have been instructed to have an in-person evaluation today at a local Urgent Care facility, please use the link below. It will take you to a list of all of our available [facility], including address, phone number and hours of operation. Please do not delay care.  [facility]  If you or a family member do not have a primary care provider, use the link below to schedule a visit and establish care. When you choose a [facility] primary care physician or advanced practice provider, you gain a long-term partner in health. Find a Primary Care Provider  Learn more about [facility] in-office and virtual care options:  - Get Care Now

## 2023-08-14 ENCOUNTER — Telehealth: Payer: Self-pay

## 2023-08-14 ENCOUNTER — Other Ambulatory Visit: Payer: Self-pay | Admitting: Physician Assistant

## 2023-08-14 MED ORDER — MOLNUPIRAVIR EUA 200MG CAPSULE: 4.0000 | ORAL_CAPSULE | Freq: Two times a day (BID) | ORAL | 0 refills | Status: AC

## 2023-08-14 NOTE — Telephone Encounter (Signed)
Copied from CRM [id number]. Topic: Clinical - Prescription Issue >> Aug 14, 2023  8:30 AM Adele Barthel wrote: Reason for CRM: Patient tested positive for COVID and was seen by [facility] urgent care on 02/11. Was prescribed nirmatrelvir/ritonavir (PAXLOVID) 20 x 150 MG & 10 x 100MG  TABS; however, when reading the side effects, she has concerns the medication will interact with her statin and heart medications. Requests call back for guidance on how she should move forward. CB# [phone]  Message has been sent to provider to address

## 2023-08-14 NOTE — Telephone Encounter (Signed)
Reviewed. Spoke with patient about statin dose and paxlovid use but main issue is her angst about adjusting diltiazem dose giving her PSVT has been very well controlled with this. As such will have her refrain from starting paxlovid today. Will try to get molnupiravir approved for some antiviral coverage giving her medical history and risk factors. Continue other measures discussed at time of visit.

## 2023-08-15 ENCOUNTER — Ambulatory Visit: Payer: BC Managed Care – PPO | Admitting: Cardiology

## 2023-09-01 ENCOUNTER — Other Ambulatory Visit: Payer: Self-pay | Admitting: Cardiology

## 2023-09-01 ENCOUNTER — Other Ambulatory Visit: Payer: Self-pay | Admitting: Family Medicine

## 2023-09-08 ENCOUNTER — Encounter: Payer: Self-pay | Admitting: Cardiology

## 2023-09-13 MED ORDER — DILTIAZEM HCL 30 MG PO TABS: ORAL_TABLET | ORAL | 3 refills | Status: AC

## 2023-09-25 ENCOUNTER — Other Ambulatory Visit: Payer: Self-pay | Admitting: Family Medicine

## 2023-09-25 DIAGNOSIS — Z1231 Encounter for screening mammogram for malignant neoplasm of breast: Secondary | ICD-10-CM

## 2023-10-08 NOTE — Progress Notes (Unsigned)
 Electrophysiology Office Note:   Date:  10/09/2023  ID:  [name], DOB May 31, 1971, MRN [id number]  Primary Cardiologist: None Primary Heart Failure: None Electrophysiologist: Sylvan Sookdeo Jorja Loa, MD      History of Present Illness:   [name] is a 53 y.o. female with h/o [name], idiopathic pericarditis post pericardial window seen today for routine electrophysiology followup.   She has noted more anxiety around her [name] episodes.  Fortunately she has not had many episodes since increasing her dose of diltiazem.  She is now concerned about having an episode of [name] while driving as she has a 50 year old daughter.  She has approximately 1 episode a week lasting approximately 2 minutes at a time.  In October, she had 2 episodes that lasted 8 and 13 minutes.  Since last being seen in our clinic the patient reports doing well.  She has no chest pain or shortness of breath.  She is able to do all of her daily activities without restriction.  Unfortunately she continues to have episodic [name] episodes as above.  At this point, she would prefer to avoid long-term antiarrhythmics or medication changes.  she denies chest pain, palpitations, dyspnea, PND, orthopnea, nausea, vomiting, dizziness, syncope, edema, weight gain, or early satiety.   Review of systems complete and found to be negative unless listed in HPI.   EP Information / Studies Reviewed:    EKG is ordered today. Personal review as below.  EKG Interpretation Date/Time:  Wednesday October 09 2023 08:38:36 EDT Ventricular Rate:  69 PR Interval:  210 QRS Duration:  76 QT Interval:  388 QTC Calculation: 415 R Axis:   64  Text Interpretation: Sinus rhythm with 1st degree A-V block When compared with ECG of 15-May-2023 15:22, PR interval has increased Confirmed by [employer], Aleynah Rocchio ([id number]) on 10/09/2023 8:42:35 AM     Risk Assessment/Calculations:             Physical Exam:   VS:  BP 116/70 (BP Location: Left Arm, Patient Position:  Sitting, Cuff Size: Large)   Pulse 69   Ht 5\' 8"  (1.727 m)   Wt 247 lb (112 kg)   SpO2 97%   BMI 37.56 kg/m    Wt Readings from Last 3 Encounters:  10/09/23 247 lb (112 kg)  05/21/23 249 lb (112.9 kg)  05/15/23 250 lb 12.8 oz (113.8 kg)     GEN: Well nourished, well developed in no acute distress NECK: No JVD; No carotid bruits CARDIAC: Regular rate and rhythm, no murmurs, rubs, gallops RESPIRATORY:  Clear to auscultation without rales, wheezing or rhonchi  ABDOMEN: Soft, non-tender, non-distended EXTREMITIES:  No edema; No deformity   ASSESSMENT AND PLAN:    1.  [name]: Currently on diltiazem 300 mg daily.  She continues to have short episodes of [name].  She would prefer to avoid further medication changes at this point is interested in ablation.  Risk and benefits have been discussed.  She understands the risks and is agreed to the procedure.  Risk, benefits, and alternatives to EP study and radiofrequency/pulse field ablation for [name] were also discussed in detail today. These risks include but are not limited to stroke, bleeding, vascular damage, tamponade, perforation, damage to the esophagus, lungs, and other structures, pulmonary vein stenosis, worsening renal function, and death. The patient understands these risk and wishes to proceed.    2.  Idiopathic pericarditis: Post pericardial window years ago.  No current pain.  Follow up with Dr. Elberta Fortis as usual post procedure  Signed, Rosalio Catterton Jorja Loa, MD

## 2023-10-09 ENCOUNTER — Encounter: Payer: Self-pay | Admitting: Cardiology

## 2023-10-09 ENCOUNTER — Ambulatory Visit: Payer: BC Managed Care – PPO | Attending: Cardiology | Admitting: Cardiology

## 2023-10-09 VITALS — BP 116/70 | HR 69 | Ht 68.0 in | Wt 247.0 lb

## 2023-10-09 DIAGNOSIS — Z01812 Encounter for preprocedural laboratory examination: Secondary | ICD-10-CM

## 2023-10-09 DIAGNOSIS — I471 Supraventricular tachycardia, unspecified: Secondary | ICD-10-CM

## 2023-10-09 NOTE — Patient Instructions (Signed)
 Medication Instructions:  Your physician recommends that you continue on your current medications as directed. Please refer to the Current Medication list given to you today.  *If you need a refill on your cardiac medications before your next appointment, please call your pharmacy*  Lab Work: None ordered.  If you have labs (blood work) drawn today and your tests are completely normal, you will receive your results only by: MyChart Message (if you have MyChart) OR A paper copy in the mail If you have any lab test that is abnormal or we need to change your treatment, we will call you to review the results.  Testing/Procedures: Your physician has recommended that you have an ablation. Catheter ablation is a medical procedure used to treat some cardiac arrhythmias (irregular heartbeats). During catheter ablation, a long, thin, flexible tube is put into a blood vessel in your groin (upper thigh), or neck. This tube is called an ablation catheter. It is then guided to your heart through the blood vessel. Radio frequency waves destroy small areas of heart tissue where abnormal heartbeats may cause an arrhythmia to start. Please see the instruction sheet given to you today.    Follow-Up: At [facility], you and your health needs are our priority.  As part of our continuing mission to provide you with exceptional heart care, our providers are all part of one team.  This team includes your primary Cardiologist (physician) and Advanced Practice Providers or APPs (Physician Assistants and Nurse Practitioners) who all work together to provide you with the care you need, when you need it.  Your next appointment:   To be scheduled  We recommend signing up for the patient portal called "MyChart".  Sign up information is provided on this After Visit Summary.  MyChart is used to connect with patients for Virtual Visits (Telemedicine).  Patients are able to view lab/test results, encounter notes,  upcoming appointments, etc.  Non-urgent messages can be sent to your provider as well.   To learn more about what you can do with MyChart, go to [email or web address].   Other Instructions [name] Ablation will be scheduled for Friday, June 13th.  You will be contacted with instructions.      1st Floor: - Lobby - Registration  - Pharmacy  - Lab - Cafe  2nd Floor: - PV Lab - Diagnostic Testing (echo, CT, nuclear med)  3rd Floor: - Vacant  4th Floor: - TCTS (cardiothoracic surgery) - AFib Clinic - Structural Heart Clinic - Vascular Surgery  - Vascular Ultrasound  5th Floor: - HeartCare Cardiology (general and EP) - Clinical Pharmacy for coumadin, hypertension, lipid, weight-loss medications, and med management appointments    Valet parking services will be available as well.

## 2023-10-10 ENCOUNTER — Ambulatory Visit
Admission: RE | Admit: 2023-10-10 | Discharge: 2023-10-10 | Disposition: A | Discharge status: Home / Self Care | Source: Ambulatory Visit | Attending: Family Medicine | Admitting: Family Medicine

## 2023-10-10 DIAGNOSIS — Z1231 Encounter for screening mammogram for malignant neoplasm of breast: Secondary | ICD-10-CM | POA: Diagnosis not present

## 2023-10-11 NOTE — Addendum Note (Signed)
 Addended by: Alois Cliche on: 10/11/2023 03:43 PM   Modules accepted: Orders

## 2023-10-25 ENCOUNTER — Encounter: Payer: Self-pay | Admitting: Physician Assistant

## 2023-10-25 ENCOUNTER — Ambulatory Visit: Admitting: Physician Assistant

## 2023-10-25 ENCOUNTER — Ambulatory Visit (INDEPENDENT_AMBULATORY_CARE_PROVIDER_SITE_OTHER)

## 2023-10-25 VITALS — BP 100/70 | HR 68 | Temp 97.3°F | Ht 68.0 in | Wt 243.2 lb

## 2023-10-25 DIAGNOSIS — S0992XA Unspecified injury of nose, initial encounter: Secondary | ICD-10-CM

## 2023-10-25 NOTE — Progress Notes (Signed)
 [name] is a 53 y.o. female here for a new problem.  History of Present Illness:   Chief Complaint  Patient presents with   Facial Injury    Pt c/o knot right side of nose, was hit by a basketball last Thursday in the nose, painful.   Nose Injury  Patient complains of a knot located on the right side of her nose ridge.  She recalls getting hit by a basketball last Thursday. Region has been tender since then.  Patient has not applied any ice on the region has she been taking any NSAID'S to help relief the pressure.  She denies any accompanying sinus pressure.   Past Medical History:  Diagnosis Date   Breast fibroadenoma, left 08/12/2019   biospy   Chronic idiopathic pericarditis 05/13/2017   Family history of early CAD April 16, 2020   Father died age 43   HLD (hyperlipidemia) 09/16/2017   diet controlled   Inflammatory polyps of colon (HCC) 10/12/2020   Colonoscopy 09/2020, Dr. General Kenner   Obesity (BMI 30-39.9) 05/13/2017   Onychomycosis 07/29/2018   PSVT (paroxysmal supraventricular tachycardia) (HCC) 05/13/2017   dx 2018     Social History   Tobacco Use   Smoking status: Never    Passive exposure: Never   Smokeless tobacco: Never  Vaping Use   Vaping status: Never Used  Substance Use Topics   Alcohol use: Yes    Alcohol/week: 1.0 standard drink of alcohol    Types: 1 Glasses of wine per week    Comment: 2x per month   Drug use: No    Past Surgical History:  Procedure Laterality Date   BREAST BIOPSY Left 08/11/2019   CESAREAN SECTION     PERICARDIAL WINDOW     WISDOM TOOTH EXTRACTION      Family History  Problem Relation Age of Onset   Arthritis Mother    Thyroid  disease Mother    Diabetes Father    Early death Father        Cardiogenic shock/CAD/MI   High Cholesterol Father    Heart disease Father    Heart attack Father 81       and again at age 20   Skin cancer Maternal Grandfather    Lung cancer Paternal Grandmother        non-smoker   Colon  polyps Neg Hx    Colon cancer Neg Hx    Esophageal cancer Neg Hx    Stomach cancer Neg Hx    Rectal cancer Neg Hx     No Known Allergies  Current Medications:   Current Outpatient Medications:    diltiazem  (CARDIZEM  CD) 300 MG 24 hr capsule, TAKE 1 CAPSULE BY MOUTH EVERY DAY, Disp: 90 capsule, Rfl: 2   diltiazem  (CARDIZEM ) 30 MG tablet, Take one tablet by mouth every 6 hours as needed for palpitations., Disp: 30 tablet, Rfl: 3   norgestimate -ethinyl estradiol  (ORTHO-CYCLEN) 0.25-35 MG-MCG tablet, TAKE 1 TABLET BY MOUTH EVERY DAY, Disp: 84 tablet, Rfl: 0   rosuvastatin  (CRESTOR ) 10 MG tablet, TAKE 1 TABLET BY MOUTH EVERY DAY, Disp: 90 tablet, Rfl: 3   Review of Systems:   Review of Systems  HENT:  Negative for sinus pain.   Negative unless otherwise specified per HPI.  Vitals:   Vitals:   10/25/23 0805  BP: 100/70  Pulse: 68  Temp: (!) 97.3 F (36.3 C)  TempSrc: Temporal  SpO2: 98%  Weight: 243 lb 4 oz (110.3 kg)  Height: 5\' 8"  (1.727 m)  Body mass index is 36.99 kg/m.  Physical Exam:   Physical Exam Vitals and nursing note reviewed.  Constitutional:      General: She is not in acute distress.    Appearance: She is well-developed. She is not ill-appearing or toxic-appearing.  HENT:     Head: Normocephalic and atraumatic.     Right Ear: Tympanic membrane, ear canal and external ear normal. Tympanic membrane is not erythematous, retracted or bulging.     Left Ear: Tympanic membrane, ear canal and external ear normal. Tympanic membrane is not erythematous, retracted or bulging.     Nose: Nose normal.     Right Sinus: No maxillary sinus tenderness or frontal sinus tenderness.     Left Sinus: No maxillary sinus tenderness or frontal sinus tenderness.      Mouth/Throat:     Pharynx: Uvula midline. No posterior oropharyngeal erythema.  Eyes:     General: Lids are normal.     Conjunctiva/sclera: Conjunctivae normal.  Neck:     Trachea: Trachea normal.   Cardiovascular:     Rate and Rhythm: Normal rate and regular rhythm.     Heart sounds: Normal heart sounds, S1 normal and S2 normal.  Pulmonary:     Effort: Pulmonary effort is normal.     Breath sounds: Normal breath sounds. No decreased breath sounds, wheezing, rhonchi or rales.  Lymphadenopathy:     Cervical: No cervical adenopathy.  Skin:    General: Skin is warm and dry.  Neurological:     Mental Status: She is alert.  Psychiatric:        Speech: Speech normal.        Behavior: Behavior normal. Behavior is cooperative.     Assessment and Plan:   Injury of nose, initial encounter No red flags Will get imaging today Recommend ice and ibuprofen   Consider ENT evaluation if fracture or new symptom(s) Emergency room precautions reviewed with patient   Alexander Iba, PA-C  I,Safa M Kadhim,acting as a scribe for Alexander Iba, PA.,have documented all relevant documentation on the behalf of Alexander Iba, PA,as directed by  Alexander Iba, PA while in the presence of Alexander Iba, [place].   I, Alexander Iba, [place], have reviewed all documentation for this visit. The documentation on 10/25/23 for the exam, diagnosis, procedures, and orders are all accurate and complete.

## 2023-10-26 ENCOUNTER — Other Ambulatory Visit: Payer: Self-pay | Admitting: Family Medicine

## 2023-10-26 DIAGNOSIS — N926 Irregular menstruation, unspecified: Secondary | ICD-10-CM

## 2023-11-15 DIAGNOSIS — F4322 Adjustment disorder with anxiety: Secondary | ICD-10-CM | POA: Diagnosis not present

## 2023-11-22 DIAGNOSIS — I471 Supraventricular tachycardia, unspecified: Secondary | ICD-10-CM | POA: Diagnosis not present

## 2023-11-22 DIAGNOSIS — Z01812 Encounter for preprocedural laboratory examination: Secondary | ICD-10-CM | POA: Diagnosis not present

## 2023-11-22 LAB — CBC
Hematocrit: 42.6 % (ref 34.0–46.6)
Hemoglobin: 13.8 g/dL (ref 11.1–15.9)
MCH: 27.9 pg (ref 26.6–33.0)
MCHC: 32.4 g/dL (ref 31.5–35.7)
MCV: 86 fL (ref 79–97)
Platelets: 362 10*3/uL (ref 150–450)
RBC: 4.95 x10E6/uL (ref 3.77–5.28)
RDW: 12.2 % (ref 11.7–15.4)
WBC: 9.4 10*3/uL (ref 3.4–10.8)

## 2023-11-23 LAB — BASIC METABOLIC PANEL WITH GFR
BUN/Creatinine Ratio: 22 (ref 9–23)
BUN: 14 mg/dL (ref 6–24)
CO2: 19 mmol/L — ABNORMAL LOW (ref 20–29)
Calcium: 9.9 mg/dL (ref 8.7–10.2)
Chloride: 103 mmol/L (ref 96–106)
Creatinine, Ser: 0.65 mg/dL (ref 0.57–1.00)
Glucose: 76 mg/dL (ref 70–99)
Potassium: 4.5 mmol/L (ref 3.5–5.2)
Sodium: 140 mmol/L (ref 134–144)
eGFR: 107 mL/min/{1.73_m2} (ref >59)

## 2023-11-26 ENCOUNTER — Encounter: Payer: Self-pay | Admitting: Family Medicine

## 2023-11-28 DIAGNOSIS — F4322 Adjustment disorder with anxiety: Secondary | ICD-10-CM | POA: Diagnosis not present

## 2023-12-05 ENCOUNTER — Telehealth (HOSPITAL_COMMUNITY): Payer: Self-pay

## 2023-12-05 NOTE — Telephone Encounter (Signed)
 Spoke with patient to discuss upcoming procedure.   Labs: completed.   Any recent signs of acute illness or been started on antibiotics? No Any new medications started? No Any medications to hold?  Per Dr. Lawana Pray, hold Diltiazem  for 2 days prior to procedure- last dose on June 10. You may take 30 mg diltiazem  as needed. Patient requested information to also be sent to her MyChart. Medication instructions:  On the morning of your procedure DO NOT take any medication., or the procedure may be rescheduled. Nothing to eat or drink after midnight prior to your procedure.  Confirmed patient is scheduled for [name] Ablation on Friday, June 13 with Dr. Agatha Horsfall. Instructed patient to arrive at the Main Entrance A at [facility]: [address] [place], [place] [address] and check in at Admitting at 10:30 AM.  Advised of plan to go home the same day and will only stay overnight if medically necessary. You MUST have a responsible adult to drive you home and MUST be with you the first 24 hours after you arrive home or your procedure could be cancelled.  Patient verbalized understanding to all instructions provided and agreed to proceed with procedure.

## 2023-12-09 DIAGNOSIS — F4322 Adjustment disorder with anxiety: Secondary | ICD-10-CM | POA: Diagnosis not present

## 2023-12-12 NOTE — Pre-Procedure Instructions (Signed)
 Instructed patient on the following items: Arrival time 1000 Nothing to eat or drink after midnight No meds AM of procedure Responsible person to drive you home and stay with you for 24 hrs  Metoprolol-last dose was 6/10

## 2023-12-13 ENCOUNTER — Encounter (HOSPITAL_COMMUNITY): Payer: Self-pay | Admitting: Cardiology

## 2023-12-13 ENCOUNTER — Ambulatory Visit (HOSPITAL_COMMUNITY)
Admission: RE | Disposition: A | Discharge status: Home / Self Care | Payer: Self-pay | Source: Home / Self Care | Attending: Cardiology

## 2023-12-13 ENCOUNTER — Ambulatory Visit (HOSPITAL_COMMUNITY)
Admission: RE | Admit: 2023-12-13 | Discharge: 2023-12-13 | Disposition: A | Discharge status: Home / Self Care | Attending: Cardiology | Admitting: Cardiology

## 2023-12-13 ENCOUNTER — Other Ambulatory Visit: Payer: Self-pay

## 2023-12-13 ENCOUNTER — Ambulatory Visit (HOSPITAL_COMMUNITY): Admitting: Anesthesiology

## 2023-12-13 DIAGNOSIS — I471 Supraventricular tachycardia, unspecified: Secondary | ICD-10-CM | POA: Diagnosis not present

## 2023-12-13 DIAGNOSIS — I4719 Other supraventricular tachycardia: Secondary | ICD-10-CM | POA: Insufficient documentation

## 2023-12-13 DIAGNOSIS — F419 Anxiety disorder, unspecified: Secondary | ICD-10-CM | POA: Insufficient documentation

## 2023-12-13 HISTORY — PX: SVT ABLATION: EP1225

## 2023-12-13 LAB — PREGNANCY, URINE: Preg Test, Ur: NEGATIVE

## 2023-12-13 SURGERY — SVT ABLATION
Anesthesia: General

## 2023-12-13 MED ORDER — ONDANSETRON HCL 4 MG/2ML IJ SOLN
4.0000 mg | Freq: Four times a day (QID) | INTRAMUSCULAR | Status: DC | PRN
Start: 2023-12-13 — End: 2023-12-13

## 2023-12-13 MED ORDER — ACETAMINOPHEN 325 MG PO TABS: 650.0000 mg | ORAL_TABLET | ORAL | Status: DC | PRN

## 2023-12-13 MED ORDER — HEPARIN (PORCINE) IN NACL 1000-0.9 UT/500ML-% IV SOLN
INTRAVENOUS | Status: DC | PRN
  Administered 2023-12-13: 500 mL

## 2023-12-13 MED ORDER — ONDANSETRON HCL 4 MG/2ML IJ SOLN
INTRAMUSCULAR | Status: DC | PRN
  Administered 2023-12-13: 4 mg via INTRAVENOUS

## 2023-12-13 MED ORDER — SODIUM CHLORIDE 0.9% FLUSH: 3.0000 mL | INTRAVENOUS | Status: DC | PRN

## 2023-12-13 MED ORDER — PROPOFOL 10 MG/ML IV BOLUS
INTRAVENOUS | Status: DC | PRN
  Administered 2023-12-13 (×4): 30 mg via INTRAVENOUS
  Administered 2023-12-13: 40 mg via INTRAVENOUS

## 2023-12-13 MED ORDER — SODIUM CHLORIDE 0.9 % IV SOLN: INTRAVENOUS | Status: DC

## 2023-12-13 MED ORDER — LIDOCAINE 2% (20 MG/ML) 5 ML SYRINGE
INTRAMUSCULAR | Status: DC | PRN
  Administered 2023-12-13: 60 mg via INTRAVENOUS

## 2023-12-13 MED ORDER — PROPOFOL 500 MG/50ML IV EMUL
INTRAVENOUS | Status: DC | PRN
  Administered 2023-12-13: 75 ug/kg/min via INTRAVENOUS

## 2023-12-13 MED ORDER — BUPIVACAINE HCL (PF) 0.25 % IJ SOLN
INTRAMUSCULAR | Status: AC
  Filled 2023-12-13: qty 30

## 2023-12-13 MED ORDER — SODIUM CHLORIDE 0.9 % IV SOLN: 250.0000 mL | INTRAVENOUS | Status: DC | PRN

## 2023-12-13 SURGICAL SUPPLY — 12 items
CATH DECANAV D CURVE (CATHETERS) IMPLANT
CATH EZ STEER NAV 4MM D-F CUR (ABLATOR) IMPLANT
CATH JOSEPH QUAD ALLRED 6F REP (CATHETERS) IMPLANT
CLOSURE MYNX CONTROL 6F/7F (Vascular Products) IMPLANT
MAT PREVALON FULL STRYKER (MISCELLANEOUS) IMPLANT
PACK EP LF (CUSTOM PROCEDURE TRAY) ×1 IMPLANT
PAD DEFIB RADIO PHYSIO CONN (PAD) ×1 IMPLANT
PATCH CARTO3 (PAD) IMPLANT
SHEATH PINNACLE 6F 10CM (SHEATH) IMPLANT
SHEATH PINNACLE 7F 10CM (SHEATH) IMPLANT
SHEATH PINNACLE 8F 10CM (SHEATH) IMPLANT
SHEATH PROBE COVER 6X72 (BAG) IMPLANT

## 2023-12-13 NOTE — Discharge Instructions (Signed)

## 2023-12-13 NOTE — H&P (Signed)
  Electrophysiology Office Note:   Date:  12/13/2023  ID:  [name], DOB Jul 03, 1970, MRN [id number]  Primary Cardiologist: None Primary Heart Failure: None Electrophysiologist: Lynsey Ange Cortland Ding, MD      History of Present Illness:   [name] is a 53 y.o. female with h/o [name], idiopathic pericarditis post pericardial window seen today for routine electrophysiology followup.   She has noted more anxiety around her [name] episodes.  Fortunately she has not had many episodes since increasing her dose of diltiazem .  She is now concerned about having an episode of [name] while driving as she has a 16 year old daughter.  She has approximately 1 episode a week lasting approximately 2 minutes at a time.  In October, she had 2 episodes that lasted 8 and 13 minutes.  Today, denies symptoms of palpitations, chest pain, dyspnea, orthopnea, PND, lower extremity edema, claudication, dizziness, presyncope, syncope, bleeding, or neurologic sequela. The patient is tolerating medications without difficulties. Plan ablation today.   EP Information / Studies Reviewed:    EKG is ordered today. Personal review as below.        Risk Assessment/Calculations:             Physical Exam:   VS:  There were no vitals taken for this visit.   Wt Readings from Last 3 Encounters:  10/25/23 110.3 kg  10/09/23 112 kg  05/21/23 112.9 kg     GEN: Well nourished, well developed in no acute distress NECK: No JVD; No carotid bruits CARDIAC: Regular rate and rhythm, no murmurs, rubs, gallops RESPIRATORY:  Clear to auscultation without rales, wheezing or rhonchi  ABDOMEN: Soft, non-tender, non-distended EXTREMITIES:  No edema; No deformity   ASSESSMENT AND PLAN:    1.  [name]: [name] has presented today for surgery, with the diagnosis of [name].  The various methods of treatment have been discussed with the patient and family. After consideration of risks, benefits and other options for treatment, the  patient has consented to  Procedure(s): Catheter ablation as a surgical intervention .  Risks include but not limited to complete heart block, stroke, esophageal damage, nerve damage, bleeding, vascular damage, tamponade, perforation, MI, and death. The patient's history has been reviewed, patient examined, no change in status, stable for surgery.  I have reviewed the patient's chart and labs.  Questions were answered to the patient's satisfaction.    Pamela Orsino Lawana Pray, MD 12/13/2023 9:57 AM

## 2023-12-13 NOTE — Anesthesia Preprocedure Evaluation (Addendum)
 Anesthesia Evaluation  Patient identified by MRN, date of birth, ID band Patient awake    Reviewed: Allergy & Precautions, H&P , NPO status , Patient's Chart, lab work & pertinent test results  Airway Mallampati: II  TM Distance: >3 FB Neck ROM: Full    Dental no notable dental hx.    Pulmonary neg pulmonary ROS   Pulmonary exam normal breath sounds clear to auscultation       Cardiovascular Normal cardiovascular exam+ dysrhythmias Supra Ventricular Tachycardia  Rhythm:Regular Rate:Normal     Neuro/Psych negative neurological ROS  negative psych ROS   GI/Hepatic negative GI ROS, Neg liver ROS,,,  Endo/Other  negative endocrine ROS    Renal/GU negative Renal ROS  negative genitourinary   Musculoskeletal negative musculoskeletal ROS (+)    Abdominal  (+) + obese  Peds negative pediatric ROS (+)  Hematology negative hematology ROS (+)   Anesthesia Other Findings   Reproductive/Obstetrics negative OB ROS                             Anesthesia Physical Anesthesia Plan  ASA: 2  Anesthesia Plan: MAC   Post-op Pain Management: Minimal or no pain anticipated   Induction: Intravenous  PONV Risk Score and Plan: 2 and Ondansetron , Midazolam and Treatment may vary due to age or medical condition  Airway Management Planned: Simple Face Mask  Additional Equipment:   Intra-op Plan:   Post-operative Plan:   Informed Consent: I have reviewed the patients History and Physical, chart, labs and discussed the procedure including the risks, benefits and alternatives for the proposed anesthesia with the patient or authorized representative who has indicated his/her understanding and acceptance.     Dental advisory given  Plan Discussed with: CRNA  Anesthesia Plan Comments:        Anesthesia Quick Evaluation

## 2023-12-14 ENCOUNTER — Encounter (HOSPITAL_COMMUNITY): Payer: Self-pay | Admitting: Cardiology

## 2023-12-16 ENCOUNTER — Encounter (HOSPITAL_COMMUNITY): Payer: Self-pay | Admitting: Cardiology

## 2023-12-16 MED FILL — Bupivacaine HCl Preservative Free (PF) Inj 0.25%: INTRAMUSCULAR | Qty: 30 | Status: AC

## 2023-12-16 NOTE — Anesthesia Postprocedure Evaluation (Signed)
 Anesthesia Post Note  Patient: [name]  Procedure(s) Performed: [name] ABLATION     Patient location during evaluation: PACU Anesthesia Type: General Level of consciousness: awake and alert Pain management: pain level controlled Vital Signs Assessment: post-procedure vital signs reviewed and stable Respiratory status: spontaneous breathing, nonlabored ventilation and respiratory function stable Cardiovascular status: blood pressure returned to baseline and stable Postop Assessment: no apparent nausea or vomiting Anesthetic complications: no   There were no known notable events for this encounter.  Last Vitals:  Vitals:   12/13/23 1530 12/13/23 1600  BP: 110/74 120/64  Pulse: (!) 57 (!) 54  Resp: (!) 21 15  Temp:    SpO2: 98% 98%    Last Pain:  Vitals:   12/13/23 1411  TempSrc:   PainSc: 0-No pain                 Earvin Goldberg

## 2023-12-16 NOTE — Transfer of Care (Signed)
 Immediate Anesthesia Transfer of Care Note  Patient: [name]  Procedure(s) Performed: [name] ABLATION  Patient Location: PACU and Cath Lab  Anesthesia Type:MAC  Level of Consciousness: awake and alert   Airway & Oxygen Therapy: Patient Spontanous Breathing and Patient connected to face mask oxygen  Post-op Assessment: Report given to RN and Post -op Vital signs reviewed and stable  Post vital signs: Reviewed and stable  Last Vitals:  Vitals Value Taken Time  BP 120/64 12/13/23 16:00  Temp 36.5 C 12/13/23 13:55  Pulse 56 12/13/23 16:09  Resp 23 12/13/23 16:09  SpO2 96 % 12/13/23 16:09  Vitals shown include unfiled device data.  Last Pain:  Vitals:   12/13/23 1411  TempSrc:   PainSc: 0-No pain         Complications: There were no known notable events for this encounter.

## 2023-12-27 ENCOUNTER — Encounter: Admitting: Family Medicine

## 2024-01-07 ENCOUNTER — Encounter: Admitting: Family Medicine

## 2024-01-07 NOTE — Progress Notes (Unsigned)
  Cardiology Office Note:  .   Date:  01/07/2024  ID:  [name], DOB [birth year], MRN [id number] PCP: Jodie Lavern CROME, MD   [facility] Providers Cardiologist:  None Electrophysiologist:  Will Gladis Norton, MD {  History of Present Illness: .   [name] is a 53 y.o. female w/PMHx of  Idiopathic recurrent pericarditis (2008 pericardial window in [place]  > treated initially w/methotrexate > PRN prednisone ) [name]  Referred to Dr. Norton back in 2019, minimal symptoms, brief episodes, planned for watchful waiting, w/preference to avoid meds and procedures  Following over the years and started on diltiazem   W/increasing frequency and symptoms > dose of her dilt increased and dose increased > despite that having recurrent SVTs and planned for ablation once the summer rolled around (on staff at [facility])  She saw Dr. Norton 10/09/23, wanting to avoid AAD, more meds > planned for ablation  12/13/23, EPS w/AVNRT slow pathway ablated    Today's visit is scheduled as her post ablation visit ROS:   She has had a couple times that she thought the tachycardia might start up but didn't. She has not had any palpitations/tachycardia post ablation No groin concerns/complications No CP, symptoms of her pericarditis No near syncope or syncope No exertional intolerances, SOB Occ feels the need to take in a deep breath    Studies Reviewed: SABRA    EKG done today and reviewed by myself:  SR 74bpm, normal intervals  12/13/23: EPS/ablation CONCLUSIONS:  1. Sinus rhythm upon presentation.  2. The patient had dual AV nodal physiology with easily inducible classic AV nodal reentrant tachycardia, there were no other accessory pathways or arrhythmias induced  3. Successful radiofrequency modification of the slow AV nodal pathway  4. No inducible arrhythmias following ablation.  5. No early apparent complications.    TTE 11/29/17  - Left ventricle: The cavity size was normal. Systolic  function was   normal. The estimated ejection fraction was in the range of 55%   to 60%. Wall motion was normal; there were no regional wall   motion abnormalities. Left ventricular diastolic function   parameters were normal. - Atrial septum: No defect or patent foramen ovale was identified.     Risk Assessment/Calculations:    Physical Exam:   VS:  There were no vitals taken for this visit.   Wt Readings from Last 3 Encounters:  12/13/23 247 lb (112 kg)  10/25/23 243 lb 4 oz (110.3 kg)  10/09/23 247 lb (112 kg)    GEN: Well nourished, well developed in no acute distress NECK: No JVD; No carotid bruits CARDIAC: RRR, no murmurs, rubs, gallops RESPIRATORY:   CTA b/l without rales, wheezing or rhonchi  ABDOMEN: Soft, non-tender, non-distended EXTREMITIES:  No edema; No deformity   ASSESSMENT AND PLAN: .    [name] S/p ablation AVNRT slow pathway  Idiopathic recurrent pericarditis Last echo 2019, no effusion No symptoms  Dispo: back in 39mo, sooner if needed  Signed, Charlies Macario Arthur, PA-C

## 2024-01-09 ENCOUNTER — Ambulatory Visit: Attending: Physician Assistant | Admitting: Physician Assistant

## 2024-01-09 VITALS — BP 112/70 | HR 74 | Ht 68.5 in | Wt 245.0 lb

## 2024-01-09 DIAGNOSIS — I300 Acute nonspecific idiopathic pericarditis: Secondary | ICD-10-CM | POA: Diagnosis not present

## 2024-01-09 DIAGNOSIS — I471 Supraventricular tachycardia, unspecified: Secondary | ICD-10-CM

## 2024-01-09 NOTE — Patient Instructions (Signed)
 Medication Instructions:   Your physician recommends that you continue on your current medications as directed. Please refer to the Current Medication list given to you today.  *If you need a refill on your cardiac medications before your next appointment, please call your pharmacy*  Lab Work:  NONE ORDERED  TODAY   If you have labs (blood work) drawn today and your tests are completely normal, you will receive your results only by: MyChart Message (if you have MyChart) OR A paper copy in the mail If you have any lab test that is abnormal or we need to change your treatment, we will call you to review the results.  Testing/Procedures: NONE ORDERED  TODAY    Follow-Up: At [facility], you and your health needs are our priority.  As part of our continuing mission to provide you with exceptional heart care, our providers are all part of one team.  This team includes your primary Cardiologist (physician) and Advanced Practice Providers or APPs (Physician Assistants and Nurse Practitioners) who all work together to provide you with the care you need, when you need it.  Your next appointment:    6 month(s)   Provider:   You may see Will Cortland Ding, MD or one of the following Advanced Practice Providers on your designated Care Team:   Mertha Abrahams, [place]    We recommend signing up for the patient portal called "MyChart".  Sign up information is provided on this After Visit Summary.  MyChart is used to connect with patients for Virtual Visits (Telemedicine).  Patients are able to view lab/test results, encounter notes, upcoming appointments, etc.  Non-urgent messages can be sent to your provider as well.   To learn more about what you can do with MyChart, go to [email or web address].   Other Instructions

## 2024-01-21 ENCOUNTER — Other Ambulatory Visit: Payer: Self-pay | Admitting: Family Medicine

## 2024-01-21 DIAGNOSIS — N926 Irregular menstruation, unspecified: Secondary | ICD-10-CM

## 2024-01-30 ENCOUNTER — Encounter: Payer: Self-pay | Admitting: Family Medicine

## 2024-01-30 ENCOUNTER — Ambulatory Visit: Admitting: Family Medicine

## 2024-01-30 VITALS — BP 118/79 | HR 74 | Temp 97.9°F | Ht 68.5 in | Wt 242.6 lb

## 2024-01-30 DIAGNOSIS — E782 Mixed hyperlipidemia: Secondary | ICD-10-CM | POA: Diagnosis not present

## 2024-01-30 DIAGNOSIS — I471 Supraventricular tachycardia, unspecified: Secondary | ICD-10-CM | POA: Diagnosis not present

## 2024-01-30 DIAGNOSIS — Z3041 Encounter for surveillance of contraceptive pills: Secondary | ICD-10-CM

## 2024-01-30 DIAGNOSIS — N951 Menopausal and female climacteric states: Secondary | ICD-10-CM

## 2024-01-30 DIAGNOSIS — Z0001 Encounter for general adult medical examination with abnormal findings: Secondary | ICD-10-CM

## 2024-01-30 DIAGNOSIS — Z0000 Encounter for general adult medical examination without abnormal findings: Secondary | ICD-10-CM

## 2024-01-30 DIAGNOSIS — I319 Disease of pericardium, unspecified: Secondary | ICD-10-CM

## 2024-01-30 LAB — CBC WITH DIFFERENTIAL/PLATELET
Basophils Absolute: 0 K/uL (ref 0.0–0.1)
Basophils Relative: 0.6 % (ref 0.0–3.0)
Eosinophils Absolute: 0.1 K/uL (ref 0.0–0.7)
Eosinophils Relative: 1 % (ref 0.0–5.0)
HCT: 41.4 % (ref 36.0–46.0)
Hemoglobin: 13.5 g/dL (ref 12.0–15.0)
Lymphocytes Relative: 19.1 % (ref 12.0–46.0)
Lymphs Abs: 1.6 K/uL (ref 0.7–4.0)
MCHC: 32.7 g/dL (ref 30.0–36.0)
MCV: 83 fl (ref 78.0–100.0)
Monocytes Absolute: 0.5 K/uL (ref 0.1–1.0)
Monocytes Relative: 5.5 % (ref 3.0–12.0)
Neutro Abs: 6.3 K/uL (ref 1.4–7.7)
Neutrophils Relative %: 73.8 % (ref 43.0–77.0)
Platelets: 313 K/uL (ref 150.0–400.0)
RBC: 4.99 Mil/uL (ref 3.87–5.11)
RDW: 13.3 % (ref 11.5–15.5)
WBC: 8.6 K/uL (ref 4.0–10.5)

## 2024-01-30 LAB — LIPID PANEL
Cholesterol: 178 mg/dL (ref 0–200)
HDL: 62.1 mg/dL (ref >39.00)
LDL Cholesterol: 90 mg/dL (ref 0–99)
NonHDL: 115.68
Total CHOL/HDL Ratio: 3
Triglycerides: 128 mg/dL (ref 0.0–149.0)
VLDL: 25.6 mg/dL (ref 0.0–40.0)

## 2024-01-30 LAB — COMPREHENSIVE METABOLIC PANEL WITH GFR
ALT: 11 U/L (ref 0–35)
AST: 18 U/L (ref 0–37)
Albumin: 4.1 g/dL (ref 3.5–5.2)
Alkaline Phosphatase: 68 U/L (ref 39–117)
BUN: 18 mg/dL (ref 6–23)
CO2: 27 meq/L (ref 19–32)
Calcium: 9.9 mg/dL (ref 8.4–10.5)
Chloride: 105 meq/L (ref 96–112)
Creatinine, Ser: 0.72 mg/dL (ref 0.40–1.20)
GFR: 97 mL/min (ref >60.00)
Glucose, Bld: 93 mg/dL (ref 70–99)
Potassium: 4.2 meq/L (ref 3.5–5.1)
Sodium: 139 meq/L (ref 135–145)
Total Bilirubin: 0.5 mg/dL (ref 0.2–1.2)
Total Protein: 7.4 g/dL (ref 6.0–8.3)

## 2024-01-30 LAB — TSH: TSH: 0.76 u[IU]/mL (ref 0.35–5.50)

## 2024-01-30 NOTE — Progress Notes (Signed)
 Subjective  Chief Complaint  Patient presents with   Annual Exam    Pt here for Annual Exam and is not currently fasting     HPI: [name] is a 53 y.o. female who presents to [facility] today for a Female Wellness Visit. She also has the concerns and/or needs as listed above in the chief complaint. These will be addressed in addition to the Health Maintenance Visit.   Wellness Visit: annual visit with health maintenance review and exam  Health maintenance: Mammogram, Pap smear and colorectal cancer screens are all current.  Immunizations are up-to-date.  Has completed both Shingrix vaccinations, did get flulike illness afterwards.  Eligible for RSV and possibly hepatitis B vaccinations as well.  Working on healthy lifestyle.  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  She is seeing a therapist to help navigate this.  Working on better sleep, better nutrition and hoping to start exercise. Chronic disease f/u and/or acute problem visit: (deemed necessary to be done in addition to the wellness visit): History of PSVT status post ablation therapy.  Doing well with normal postprocedural follow-up.  I reviewed notes. Hyperlipidemia on Crestor  10.  Nonfasting for recheck today.  Tolerating well. Oral contraceptive use: Regular cycles on birth control pill.  We discussed risks versus benefits of continued use.  Cardiovascular risk factors include hyperlipidemia and strong family history although her cardiac screens have been low risk.  We discussed possible increased risk of thromboembolism due to obesity and estrogen use, breast cancer risk.  We discussed low potential for pregnancy at age 10.  Assessment  1. Encounter for well adult exam with abnormal findings   2. Chronic idiopathic pericarditis, unspecified complication status   3. Mixed hyperlipidemia   4. Oral contraceptive pill  surveillance   5. PSVT (paroxysmal supraventricular tachycardia) [facility])      Plan  Female Wellness Visit: Age appropriate Health Maintenance and Prevention measures were discussed with patient. Included topics are cancer screening recommendations, ways to keep healthy (see AVS) including dietary and exercise recommendations, regular eye and dental care, use of seat belts, and avoidance of moderate alcohol use and tobacco use.  BMI: discussed patient's BMI and encouraged positive lifestyle modifications to help get to or maintain a target BMI. HM needs and immunizations were addressed and ordered. See below for orders. See HM and immunization section for updates. Routine labs and screening tests ordered including cmp, cbc and lipids where appropriate. Discussed recommendations regarding Vit D and calcium  supplementation (see AVS)  Chronic disease management visit and/or acute problem visit: PSVT status post ablation.  Doing well.  She has Cardizem  to be used only if needed. Chronic pericarditis followed by cardiology and stable Obesity and stress reaction: Counseling done.  Continue therapy.  Continue healthy lifestyle Oral contraceptive use: After consideration of above, she will stop birth control pills.  She will return and check an FSH 4 weeks after being off of them to see if she is yet postmenopausal.  We discussed low risk of pregnancy.  If not yet postmenopausal, will use condoms.  Monitor for perimenopausal symptoms.    Follow up: Physical in 12 months, return 4 weeks after stopping birth control to check [facility].  Labs ordered Orders Placed This Encounter  Procedures   CBC with Differential/Platelet   Comprehensive metabolic panel with GFR   Lipid panel   TSH   No orders  of the defined types were placed in this encounter.     Body mass index is 36.35 kg/m. Wt Readings from Last 3 Encounters:  01/30/24 242 lb 9.6 oz (110 kg)  01/09/24 245 lb (111.1 kg)  12/13/23 247 lb (112 kg)      Patient Active Problem List   Diagnosis Date Noted   Family history of early CAD 03/17/2020    Priority: High    Father MI 32, died age 25 Cardiac calcium  score 0 2022    Mixed hyperlipidemia 03/17/2020    Priority: High    Crestor  10 2024 with good response    Chronic idiopathic pericarditis 05/13/2017    Priority: High   Obesity (BMI 30-39.9) 05/13/2017    Priority: High   Colon polyp 10/12/2020    Priority: Medium     Inflammatory polyp w/o dysplasia: Colonoscopy 09/2020, Dr. Leigh    Breast fibroadenoma, left 08/12/2019    Priority: Medium     biospy 08/2019    Oral contraceptive pill surveillance 08/04/2019    Priority: Medium    PSVT (paroxysmal supraventricular tachycardia) (HCC) 05/13/2017    Priority: Medium    Pulmonary nodule less than 6 mm in diameter with low risk for malignant neoplasm 08/15/2021    Seen in cardiac calcium  score skin: 3 mm, left lower lobe.  No other nodules.  Low risk patient.  No further imaging recommended.    Health Maintenance  Topic Date Due   Hepatitis B Vaccines (1 of 3 - 19+ 3-dose series) Never done   COVID-19 Vaccine (6 - Moderna risk 2024-25 season) 01/06/2024   INFLUENZA VACCINE  01/31/2024   MAMMOGRAM  10/09/2024   Cervical Cancer Screening (HPV/Pap Cotest)  08/15/2026   Colonoscopy  10/06/2030   DTaP/Tdap/Td (4 - Td or Tdap) 10/02/2032   Hepatitis C Screening  Completed   Zoster Vaccines- Shingrix  Completed   HPV VACCINES  Aged Out   Meningococcal B Vaccine  Aged Out   HIV Screening  Discontinued   Immunization History  Administered Date(s) Administered   Gamma Globulin 07/18/2012   Influenza, Quadrivalent, Recombinant, Inj, Pf 05/17/2019   Influenza, Seasonal, Injecte, Preservative Fre 03/05/2023   Influenza,inj,Quad PF,6+ Mos 04/18/2017, 04/18/2018, 03/17/2020   Influenza-Unspecified 03/26/2021, 05/13/2022   MMR 06/26/2018   Moderna Covid-19 Vaccine Bivalent Booster 46yrs & up 04/22/2021   Moderna  Sars-Covid-2 Vaccination 09/04/2019, 10/02/2019, 05/08/2020   Tdap 07/18/2012, 08/19/2012, 10/03/2022   Unspecified SARS-COV-2 Vaccination 07/09/2023   Zoster Recombinant(Shingrix) 07/27/2023, 10/22/2023   We updated and reviewed the patient's past history in detail and it is documented below. Allergies: Patient has no known allergies. Past Medical History Patient  has a past medical history of Breast fibroadenoma, left (08/12/2019), Chronic idiopathic pericarditis (05/13/2017), Family history of early CAD (03/17/2020), HLD (hyperlipidemia) (09/16/2017), Inflammatory polyps of colon (HCC) (10/12/2020), Obesity (BMI 30-39.9) (05/13/2017), Onychomycosis (07/29/2018), and PSVT (paroxysmal supraventricular tachycardia) (HCC) (05/13/2017). Past Surgical History Patient  has a past surgical history that includes Pericardial window; Cesarean section; Breast biopsy (Left, 08/11/2019); Wisdom tooth extraction; and [name] ABLATION (N/A, 12/13/2023). Family History: Patient family history includes Arthritis in her mother; Diabetes in her father; Early death in her father; Heart attack (age of onset: 48) in her father; Heart disease in her father; High Cholesterol in her father; Lung cancer in her paternal grandmother; Skin cancer in her maternal grandfather; Thyroid  disease in her mother. Social History:  Patient  reports that she has never smoked. She has never been exposed to tobacco smoke.  She has never used smokeless tobacco. She reports current alcohol use of about 1.0 standard drink of alcohol per week. She reports that she does not use drugs.  Review of Systems: Constitutional: negative for fever or malaise Ophthalmic: negative for photophobia, double vision or loss of vision Cardiovascular: negative for chest pain, dyspnea on exertion, or new LE swelling Respiratory: negative for SOB or persistent cough Gastrointestinal: negative for abdominal pain, change in bowel habits or melena Genitourinary:  negative for dysuria or gross hematuria, no abnormal uterine bleeding or disharge Musculoskeletal: negative for new gait disturbance or muscular weakness Integumentary: negative for new or persistent rashes, no breast lumps Neurological: negative for TIA or stroke symptoms Psychiatric: negative for SI or delusions Allergic/Immunologic: negative for hives  Patient Care Team    Relationship Specialty Notifications Start End  Jodie Lavern CROME, MD PCP - General Family Medicine  08/04/19   Inocencio Soyla Lunger, MD PCP - Electrophysiology Cardiology Admissions 01/22/18   Verlin Lonni BIRCH, MD Consulting Physician Cardiology  08/04/19   Leigh Elspeth SQUIBB, MD Consulting Physician Gastroenterology  10/12/20     Objective  Vitals: BP 118/79   Pulse 74   Temp 97.9 F (36.6 C)   Ht 5' 8.5 (1.74 m)   Wt 242 lb 9.6 oz (110 kg)   SpO2 96%   BMI 36.35 kg/m  General:  Well developed, well nourished, no acute distress  Psych:  Alert and orientedx3,normal mood and affect HEENT:  Normocephalic, atraumatic, non-icteric sclera,  supple neck without adenopathy, mass or thyromegaly Cardiovascular:  Normal S1, S2, RRR without gallop, rub or murmur Respiratory:  Good breath sounds bilaterally, CTAB with normal respiratory effort Gastrointestinal: normal bowel sounds, soft, non-tender, no noted masses. No HSM MSK: extremities without edema, joints without erythema or swelling Neurologic:    Mental status is normal.  Gross motor and sensory exams are normal.  No tremor  Commons side effects, risks, benefits, and alternatives for medications and treatment plan prescribed today were discussed, and the patient expressed understanding of the given instructions. Patient is instructed to call or message via MyChart if he/she has any questions or concerns regarding our treatment plan. No barriers to understanding were identified. We discussed Red Flag symptoms and signs in detail. Patient expressed understanding  regarding what to do in case of urgent or emergency type symptoms.  Medication list was reconciled, printed and provided to the patient in AVS. Patient instructions and summary information was reviewed with the patient as documented in the AVS. This note was prepared with assistance of Dragon voice recognition software. Occasional wrong-word or sound-a-like substitutions may have occurred due to the inherent limitations of voice recognition software

## 2024-01-30 NOTE — Patient Instructions (Signed)
 Please return in 8 weeks for lab visit. Then in 12 mo for cpe  I will release your lab results to you on your MyChart account with further instructions. You may see the results before I do, but when I review them I will send you a message with my report or have my assistant call you if things need to be discussed. Please reply to my message with any questions. Thank you!   If you have any questions or concerns, please don't hesitate to send me a message via MyChart or call the office at [phone]. Thank you for visiting with us  today! It's our pleasure caring for you.   Preventive Care 41-89 Years Old, Female Preventive care refers to lifestyle choices and visits with your health care provider that can promote health and wellness. Preventive care visits are also called wellness exams. What can I expect for my preventive care visit? Counseling Your health care provider may ask you questions about your: Medical history, including: Past medical problems. Family medical history. Pregnancy history. Current health, including: Menstrual cycle. Method of birth control. Emotional well-being. Home life and relationship well-being. Sexual activity and sexual health. Lifestyle, including: Alcohol, nicotine or tobacco, and drug use. Access to firearms. Diet, exercise, and sleep habits. Work and work Astronomer. Sunscreen use. Safety issues such as seatbelt and bike helmet use. Physical exam Your health care provider will check your: Height and weight. These may be used to calculate your BMI (body mass index). BMI is a measurement that tells if you are at a healthy weight. Waist circumference. This measures the distance around your waistline. This measurement also tells if you are at a healthy weight and may help predict your risk of certain diseases, such as type 2 diabetes and high blood pressure. Heart rate and blood pressure. Body temperature. Skin for abnormal spots. What immunizations  do I need?  Vaccines are usually given at various ages, according to a schedule. Your health care provider will recommend vaccines for you based on your age, medical history, and lifestyle or other factors, such as travel or where you work. What tests do I need? Screening Your health care provider may recommend screening tests for certain conditions. This may include: Lipid and cholesterol levels. Diabetes screening. This is done by checking your blood sugar (glucose) after you have not eaten for a while (fasting). Pelvic exam and Pap test. Hepatitis B test. Hepatitis C test. HIV (human immunodeficiency virus) test. STI (sexually transmitted infection) testing, if you are at risk. Lung cancer screening. Colorectal cancer screening. Mammogram. Talk with your health care provider about when you should start having regular mammograms. This may depend on whether you have a family history of breast cancer. BRCA-related cancer screening. This may be done if you have a family history of breast, ovarian, tubal, or peritoneal cancers. Bone density scan. This is done to screen for osteoporosis. Talk with your health care provider about your test results, treatment options, and if necessary, the need for more tests. Follow these instructions at home: Eating and drinking  Eat a diet that includes fresh fruits and vegetables, whole grains, lean protein, and low-fat dairy products. Take vitamin and mineral supplements as recommended by your health care provider. Do not drink alcohol if: Your health care provider tells you not to drink. You are pregnant, may be pregnant, or are planning to become pregnant. If you drink alcohol: Limit how much you have to 0-1 drink a day. Know how much alcohol is in your  drink. In the [place] one drink equals one 12 oz bottle of beer (355 mL), one 5 oz glass of wine (148 mL), or one 1 oz glass of hard liquor (44 mL). Lifestyle Brush your teeth every morning and night  with fluoride toothpaste. Floss one time each day. Exercise for at least 30 minutes 5 or more days each week. Do not use any products that contain nicotine or tobacco. These products include cigarettes, chewing tobacco, and vaping devices, such as e-cigarettes. If you need help quitting, ask your health care provider. Do not use drugs. If you are sexually active, practice safe sex. Use a condom or other form of protection to prevent STIs. If you do not wish to become pregnant, use a form of birth control. If you plan to become pregnant, see your health care provider for a prepregnancy visit. Take aspirin only as told by your health care provider. Make sure that you understand how much to take and what form to take. Work with your health care provider to find out whether it is safe and beneficial for you to take aspirin daily. Find healthy ways to manage stress, such as: Meditation, yoga, or listening to music. Journaling. Talking to a trusted person. Spending time with friends and family. Minimize exposure to UV radiation to reduce your risk of skin cancer. Safety Always wear your seat belt while driving or riding in a vehicle. Do not drive: If you have been drinking alcohol. Do not ride with someone who has been drinking. When you are tired or distracted. While texting. If you have been using any mind-altering substances or drugs. Wear a helmet and other protective equipment during sports activities. If you have firearms in your house, make sure you follow all gun safety procedures. Seek help if you have been physically or sexually abused. What's next? Visit your health care provider once a year for an annual wellness visit. Ask your health care provider how often you should have your eyes and teeth checked. Stay up to date on all vaccines. This information is not intended to replace advice given to you by your health care provider. Make sure you discuss any questions you have with your  health care provider. Document Revised: 12/14/2020 Document Reviewed: 12/14/2020 Elsevier Patient Education  2024 [employer].

## 2024-02-03 ENCOUNTER — Ambulatory Visit: Payer: Self-pay | Admitting: Family Medicine

## 2024-02-03 ENCOUNTER — Encounter: Admitting: Family Medicine

## 2024-02-03 NOTE — Progress Notes (Signed)
 See mychart note Dear Ms. Happ, Everything is normal! Looks good! Sincerely, Dr. Jodie

## 2024-02-20 ENCOUNTER — Encounter: Payer: Self-pay | Admitting: Family Medicine

## 2024-02-20 DIAGNOSIS — R4189 Other symptoms and signs involving cognitive functions and awareness: Secondary | ICD-10-CM

## 2024-02-27 ENCOUNTER — Ambulatory Visit: Admitting: Neurology

## 2024-02-27 ENCOUNTER — Encounter: Payer: Self-pay | Admitting: Neurology

## 2024-02-27 VITALS — BP 128/80 | HR 63 | Ht 69.0 in | Wt 244.0 lb

## 2024-02-27 DIAGNOSIS — R569 Unspecified convulsions: Secondary | ICD-10-CM | POA: Diagnosis not present

## 2024-02-27 NOTE — Progress Notes (Signed)
 [facility]  PATIENT: [name] DOB: 1971-06-14  REQUESTING CLINICIAN: Jodie Lavern CROME, MD HISTORY FROM: Patient  REASON FOR VISIT: Episode of deja- vu    HISTORICAL  CHIEF COMPLAINT:  Chief Complaint  Patient presents with   New Patient (Initial Visit)    Rm 12, NP,  alone, concern for sz     HISTORY OF PRESENT ILLNESS:  Discussed the use of AI scribe software for clinical note transcription with the patient, who gave verbal consent to proceed.  [name] is a 53 year old female with history of cardiac arrhythmia, hyperlipidemia, obesity who presents with episodes of intense deja vu and associated symptoms concerning for seizures.  Since late spring, she has experienced approximately six to seven episodes characterized by an intense feeling of deja vu, described as experienced this event before followed by 'full body wave of emotion or energy.' These episodes have primarily occurred during the summer months, with three occurrences in August and two to three in July. The first episode occurred in late spring, during which she also vomited.  During some episodes, she experiences increased salivation followed by nausea and vomiting, which seems to alleviate the episode. The episodes can occur without any apparent trigger, such as while walking or doing emails, and are not necessarily associated with stress, although she acknowledges a high level of stress at work. No previous similar episodes, tongue biting, or urinary incontinence during these events.  She has a history of PSVT, for which she underwent an ablation in July. She differentiates the current episodes from her previous PSVT experiences.  She has started counseling and meditation to manage her stress levels. She has not had any witnessed episodes, and no one else has observed these events.   Denies any history of seizures, no family history of seizures, and no reported seizures risk factors.  Denies waking up with tongue biting or urinary inontinence.       OTHER MEDICAL CONDITIONS: Cardiac arrhythmia, hyperlipidemia    REVIEW OF SYSTEMS: Full 14 system review of systems performed and negative with exception of: As noted in the HPI   ALLERGIES: No Known Allergies  HOME MEDICATIONS: Outpatient Medications Prior to Visit  Medication Sig Dispense Refill   diltiazem  (CARDIZEM ) 30 MG tablet Take one tablet by mouth every 6 hours as needed for palpitations. (Patient taking differently: Take 30 mg by mouth. Take one tablet by mouth every 6 hours as needed for palpitations.) 30 tablet 3   rosuvastatin  (CRESTOR ) 10 MG tablet TAKE 1 TABLET BY MOUTH EVERY DAY 90 tablet 3   ibuprofen  (ADVIL ) 200 MG tablet Take 400 mg by mouth every 6 (six) hours as needed for moderate pain (pain score 4-6).     norgestimate -ethinyl estradiol  (ORTHO-CYCLEN) 0.25-35 MG-MCG tablet TAKE 1 TABLET BY MOUTH EVERY DAY 84 tablet 0   No facility-administered medications prior to visit.    PAST MEDICAL HISTORY: Past Medical History:  Diagnosis Date   Breast fibroadenoma, left 08/12/2019   biospy   Chronic idiopathic pericarditis 05/13/2017   Family history of early CAD 2020-04-06   Father died age 55   HLD (hyperlipidemia) 09/16/2017   diet controlled   Inflammatory polyps of colon (HCC) 10/12/2020   Colonoscopy 09/2020, Dr. Leigh   Obesity (BMI 30-39.9) 05/13/2017   Onychomycosis 07/29/2018   PSVT (paroxysmal supraventricular tachycardia) (HCC) 05/13/2017   dx 2018    PAST SURGICAL HISTORY: Past Surgical History:  Procedure Laterality Date   BREAST BIOPSY Left 08/11/2019  CESAREAN SECTION     PERICARDIAL WINDOW     [name] ABLATION N/A 12/13/2023   Procedure: [name] ABLATION;  Surgeon: Inocencio Soyla Lunger, MD;  Location: [facility];  Service: Cardiovascular;  Laterality: N/A;   WISDOM TOOTH EXTRACTION      FAMILY HISTORY: Family History  Problem Relation Age of Onset   Arthritis Mother     Thyroid  disease Mother    Diabetes Father    Early death Father        Cardiogenic shock/CAD/MI   High Cholesterol Father    Heart disease Father    Heart attack Father 3       and again at age 40   Skin cancer Maternal Grandfather    Lung cancer Paternal Grandmother        non-smoker   Colon polyps Neg Hx    Colon cancer Neg Hx    Esophageal cancer Neg Hx    Stomach cancer Neg Hx    Rectal cancer Neg Hx     SOCIAL HISTORY: Social History   Socioeconomic History   Marital status: Married    Spouse name: Not on file   Number of children: 1   Years of education: Not on file   Highest education level: Doctorate  Occupational History   Occupation: Professor    Employer: [employer]  Tobacco Use   Smoking status: Never    Passive exposure: Never   Smokeless tobacco: Never  Vaping Use   Vaping status: Never Used  Substance and Sexual Activity   Alcohol use: Yes    Alcohol/week: 1.0 standard drink of alcohol    Types: 1 Glasses of wine per week    Comment: 2x per month   Drug use: No   Sexual activity: Yes    Birth control/protection: None  Other Topics Concern   Not on file  Social History Narrative   Right handed   Caffeine- 1 cup daily   Works as professor at [employer]: Low Risk  (01/27/2024)   Overall Financial Resource Strain (CARDIA)    Difficulty of Paying Living Expenses: Not hard at all  Food Insecurity: No Food Insecurity (01/27/2024)   Hunger Vital Sign    Worried About Running Out of Food in the Last Year: Never true    Ran Out of Food in the Last Year: Never true  Transportation Needs: No Transportation Needs (01/27/2024)   PRAPARE - Administrator, Civil Service (Medical): No    Lack of Transportation (Non-Medical): No  Physical Activity: Insufficiently Active (01/27/2024)   Exercise Vital Sign    Days of Exercise per Week: 3 days    Minutes of Exercise per Session: 30 min   Stress: No Stress Concern Present (01/27/2024)   [employer] of Occupational Health - Occupational Stress Questionnaire    Feeling of Stress: Only a little  Social Connections: Socially Isolated (01/27/2024)   Social Connection and Isolation Panel    Frequency of Communication with Friends and Family: Once a week    Frequency of Social Gatherings with Friends and Family: Once a week    Attends Religious Services: Never    Database administrator or Organizations: No    Attends Engineer, structural: Not on file    Marital Status: Married  Catering manager Violence: Not on file    PHYSICAL EXAM  GENERAL EXAM/CONSTITUTIONAL: Vitals:  Vitals:   02/27/24 1439  BP: 128/80  Pulse: 63  SpO2: 97%  Weight: 244 lb (110.7 kg)  Height: 5' 9 (1.753 m)   Body mass index is 36.03 kg/m. Wt Readings from Last 3 Encounters:  02/27/24 244 lb (110.7 kg)  01/30/24 242 lb 9.6 oz (110 kg)  01/09/24 245 lb (111.1 kg)   Patient is in no distress; well developed, nourished and groomed; neck is supple  MUSCULOSKELETAL: Gait, strength, tone, movements noted in Neurologic exam below  NEUROLOGIC: MENTAL STATUS:      No data to display         awake, alert, oriented to person, place and time recent and remote memory intact normal attention and concentration language fluent, comprehension intact, naming intact fund of knowledge appropriate  CRANIAL NERVE:  2nd, 3rd, 4th, 6th - Visual fields full to confrontation, extraocular muscles intact, no nystagmus 5th - facial sensation symmetric 7th - facial strength symmetric 8th - hearing intact 9th - palate elevates symmetrically, uvula midline 11th - shoulder shrug symmetric 12th - tongue protrusion midline  MOTOR:  normal bulk and tone, full strength in the BUE, BLE  SENSORY:  normal and symmetric to light touch  COORDINATION:  finger-nose-finger, fine finger movements normal  GAIT/STATION:  normal    DIAGNOSTIC  DATA (LABS, IMAGING, TESTING) - I reviewed patient records, labs, notes, testing and imaging myself where available.  Lab Results  Component Value Date   WBC 8.6 01/30/2024   HGB 13.5 01/30/2024   HCT 41.4 01/30/2024   MCV 83.0 01/30/2024   PLT 313.0 01/30/2024      Component Value Date/Time   NA 139 01/30/2024 1031   NA 140 11/22/2023 1510   K 4.2 01/30/2024 1031   CL 105 01/30/2024 1031   CO2 27 01/30/2024 1031   GLUCOSE 93 01/30/2024 1031   BUN 18 01/30/2024 1031   BUN 14 11/22/2023 1510   CREATININE 0.72 01/30/2024 1031   CALCIUM  9.9 01/30/2024 1031   PROT 7.4 01/30/2024 1031   ALBUMIN 4.1 01/30/2024 1031   AST 18 01/30/2024 1031   ALT 11 01/30/2024 1031   ALKPHOS 68 01/30/2024 1031   BILITOT 0.5 01/30/2024 1031   GFRNONAA >60 04/14/2023 1010   Lab Results  Component Value Date   CHOL 178 01/30/2024   HDL 62.10 01/30/2024   LDLCALC 90 01/30/2024   TRIG 128.0 01/30/2024   CHOLHDL 3 01/30/2024   No results found for: HGBA1C No results found for: VITAMINB12 Lab Results  Component Value Date   TSH 0.76 01/30/2024     ASSESSMENT AND PLAN  53 y.o. year old female with history of cardiac arrhythmia, hyperlipidemia and obesity who presents with events concerning for seizures.    Suspected focal seizures Episodes characterized by intense deja vu, a wave of emotion or energy, and occasionally nausea and vomiting. Occurred approximately six to seven times since late spring, with increased frequency in August. No association with stress or specific triggers and not witnessed by others. Presentation suggests focal seizures originating from the temporal lobe due to stereotypical nature and presence of deja vu. Adult-onset epilepsy considered, discussed potential causes including brain abnormalities, vascular issues, or previous brain injury, though etiology often unknown. - Order EEG to assess for epileptic activity. - If EEG is normal, observe and monitor  symptoms. - Consider a three-day EEG if another event occurs after a normal EEG. - If EEG shows evidence of epileptic activity, consider starting anti-seizure medication. - If EEG is abnormal, order MRI of the brain. -  Advise her to document episodes and report any new events via MyChart.      1. Seizure-like activity (HCC)     Patient Instructions  Routine EEG, I will contact you to go over the results  Continue your other medications  Please contact me if you have another events  Return sooner if worse   Orders Placed This Encounter  Procedures   EEG adult    No orders of the defined types were placed in this encounter.   Return if symptoms worsen or fail to improve.    Pastor Falling, MD 02/27/2024, 3:59 PM  [facility] [address] [place], [place] [address] [phone]

## 2024-02-27 NOTE — Patient Instructions (Signed)
 Routine EEG, I will contact you to go over the results  Continue your other medications  Please contact me if you have another events  Return sooner if worse

## 2024-03-06 ENCOUNTER — Encounter: Payer: Self-pay | Admitting: Neurology

## 2024-03-10 ENCOUNTER — Ambulatory Visit (INDEPENDENT_AMBULATORY_CARE_PROVIDER_SITE_OTHER): Admitting: Neurology

## 2024-03-10 DIAGNOSIS — R569 Unspecified convulsions: Secondary | ICD-10-CM

## 2024-03-10 NOTE — Procedures (Signed)
   History:  53 year old woman with seizure like activity   EEG classification:  Awake and asleep  Duration: 27 minutes   Technical aspects: This EEG study was done with scalp electrodes positioned according to the 10-20 International system of electrode placement. Electrical activity was reviewed with band pass filter of 1-70Hz , sensitivity of 7 uV/mm, display speed of 45mm/sec with a 60Hz  notched filter applied as appropriate. EEG data were recorded continuously and digitally stored.   Description of the recording: The background rhythms of this recording consists of a fairly well modulated medium amplitude background activity of 10-11 Hz. As the record progresses, the patient initially is in the waking state, but appears to enter the early stage II sleep during the recording, with rudimentary sleep spindles and vertex sharp wave activity seen. During the wakeful state, photic stimulation was performed, and no abnormal responses were seen. Hyperventilation was also performed, no abnormal response seen. No epileptiform discharges seen during this recording. There was no focal slowing.   Abnormality: None   Impression: This is a normal awake and sleep EEG. No evidence of interictal epileptiform discharges. Normal EEGs, however, do not rule out epilepsy.    Trang Bouse, MD [facility]

## 2024-03-11 ENCOUNTER — Ambulatory Visit: Payer: Self-pay | Admitting: Neurology

## 2024-04-11 ENCOUNTER — Other Ambulatory Visit: Payer: Self-pay | Admitting: Family Medicine

## 2024-04-11 DIAGNOSIS — N926 Irregular menstruation, unspecified: Secondary | ICD-10-CM

## 2024-04-20 DIAGNOSIS — F4322 Adjustment disorder with anxiety: Secondary | ICD-10-CM | POA: Diagnosis not present

## 2024-05-06 DIAGNOSIS — F4322 Adjustment disorder with anxiety: Secondary | ICD-10-CM | POA: Diagnosis not present

## 2024-05-21 DIAGNOSIS — F4322 Adjustment disorder with anxiety: Secondary | ICD-10-CM | POA: Diagnosis not present

## 2024-05-25 ENCOUNTER — Other Ambulatory Visit (INDEPENDENT_AMBULATORY_CARE_PROVIDER_SITE_OTHER)

## 2024-05-25 DIAGNOSIS — N951 Menopausal and female climacteric states: Secondary | ICD-10-CM | POA: Diagnosis not present

## 2024-05-25 LAB — FOLLICLE STIMULATING HORMONE: FSH: 61.4 m[IU]/mL

## 2024-06-04 NOTE — Progress Notes (Signed)
 See mychart note

## 2024-06-17 DIAGNOSIS — F4322 Adjustment disorder with anxiety: Secondary | ICD-10-CM | POA: Diagnosis not present

## 2024-06-22 ENCOUNTER — Telehealth: Admitting: Physician Assistant

## 2024-06-22 ENCOUNTER — Ambulatory Visit: Admitting: Family Medicine

## 2024-06-22 DIAGNOSIS — Z20828 Contact with and (suspected) exposure to other viral communicable diseases: Secondary | ICD-10-CM | POA: Diagnosis not present

## 2024-06-22 MED ORDER — OSELTAMIVIR PHOSPHATE 75 MG PO CAPS: 75.0000 mg | ORAL_CAPSULE | Freq: Every day | ORAL | 0 refills | Status: AC

## 2024-06-22 NOTE — Progress Notes (Signed)
 E visit for Flu like symptoms   We are sorry that you are not feeling well.  Here is how we plan to help! Based on what you have shared with me it looks like you may have exposure to a virus that causes influenza.  Influenza or the flu is  an infection caused by a respiratory virus. The flu virus is highly contagious and persons who did not receive their yearly flu vaccination may catch the flu from close contact.  We have anti-viral medications to treat the viruses that cause this infection. They are not a cure and only shorten the course of the infection. These prescriptions are most effective when they are given within the first 2 days of flu symptoms. Antiviral medications are indicated if you have a high risk of complications from the flu. You should  also consider an antiviral medication if you are in close contact with someone who is at risk. These medications can help patients avoid complications from the flu but have side effects that you should know.   Possible side effects from Tamiflu  or oseltamivir  include nausea, vomiting, diarrhea, dizziness, headaches, eye redness, sleep problems or other respiratory symptoms. You should not take Tamiflu  if you have an allergy to oseltamivir  or any to the ingredients in Tamiflu .  Based upon your symptoms and potential risk factors I have prescribed Oseltamivir  (Tamiflu ).  It has been sent to your designated pharmacy.  You will take one 75 mg capsule orally daily for the next 5 days. (Preventive dose).  If you start noting any symptoms -- fever, chills, aches, congestion, cough, etc -- please notify us  ASAP.   ANYONE WHO HAS FLU SYMPTOMS SHOULD: Stay home. The flu is highly contagious and going out or to work exposes others! Be sure to drink plenty of fluids. Water is fine as well as fruit juices, sodas and electrolyte beverages. You may want to stay away from caffeine or alcohol. If you are nauseated, try taking small sips of liquids. How do  you know if you are getting enough fluid? Your urine should be a pale yellow or almost colorless. Get rest. Taking a steamy shower or using a humidifier may help nasal congestion and ease sore throat pain. Using a saline nasal spray works much the same way. Cough drops, hard candies and sore throat lozenges may ease your cough. Line up a caregiver. Have someone check on you regularly.  GET HELP RIGHT AWAY IF: You cannot keep down liquids or your medications. You become short of breath Your fell like you are going to pass out or loose consciousness. Your symptoms persist after you have completed your treatment plan  MAKE SURE YOU  Understand these instructions. Will watch your condition. Will get help right away if you are not doing well or get worse.  Your e-visit answers were reviewed by a board certified advanced clinical practitioner to complete your personal care plan.  Depending on the condition, your plan could have included both over the counter or prescription medications.  If there is a problem please reply  once you have received a response from your provider.  Your safety is important to us .  If you have drug allergies check your prescription carefully.    You can use MyChart to ask questions about todays visit, request a non-urgent call back, or ask for a work or school excuse for 24 hours related to this e-Visit. If it has been greater than 24 hours you will need to follow up with  your provider, or enter a new e-Visit to address those concerns.  You will get an e-mail in the next two days asking about your experience.  I hope that your e-visit has been valuable and will speed your recovery. Thank you for using e-visits.   I have spent 5 minutes in review of e-visit questionnaire, review and updating patient chart, medical decision making and response to patient.   Elsie Velma Lunger, PA-C

## 2024-07-16 ENCOUNTER — Ambulatory Visit: Admitting: Podiatry

## 2024-07-16 DIAGNOSIS — L603 Nail dystrophy: Secondary | ICD-10-CM | POA: Diagnosis not present

## 2024-07-16 NOTE — Patient Instructions (Signed)

## 2024-07-16 NOTE — Progress Notes (Signed)
"  °  Subjective:  Patient ID: [name], female    DOB: [birth year],  MRN: [id number]  Chief Complaint  Patient presents with   Nail Problem    Patient presents today for potential fungus , she was seen about 3 years ago for same problem , reports discoloration and thickness.     Discussed the use of AI scribe software for clinical note transcription with the patient, who gave verbal consent to proceed.  History of Present Illness [name] is a 54 year old female with prior bilateral great toenail trauma who presents with thickened, discolored toenails.  Three years ago she had trauma to both great toenails requiring nail avulsion. Since regrowth, both great toenails have stayed thickened, discolored, brittle, irregular, and dystrophic.  She has no pain to touch but has intermittent discomfort if the nail is pulled. She denies erythema, purulence, or other signs of acute infection. She notes possible subungual change beneath one nail and is unsure if the nails are growing normally, as she finds them nearly impossible to trim.  She has not tried biotin, nail supplements, or antifungal therapy. She is concerned about the appearance of her nails and wants treatment.      Objective:  There were no vitals filed for this visit.  Physical Exam General: AAO x3, NAD  Dermatological: Bilateral hallux toenails are hypertrophic, dystrophic with some debris.  Nails are loose distally but attached proximally.  See picture below.  No edema, erythema or signs of infection.  No open lesions.  Vascular: Dorsalis Pedis artery and Posterior Tibial artery pedal pulses are 2/4 bilateral with immedate capillary fill time. There is no pain with calf compression, swelling, warmth, erythema.   Neruologic: Grossly intact via light touch bilateral.   Musculoskeletal: No gross boney pedal deformities bilateral. No pain, crepitus, or limitation noted with foot and ankle range of motion bilateral.  Muscular strength 5/5 in all groups tested bilateral.         Results Nail specimen collection Debris sampled from bilateral great toenails for laboratory fungal culture.   Assessment:   1. Nail dystrophy      Plan:  Patient was evaluated and treated and all questions answered.  Assessment and Plan Assessment & Plan Onychomycosis Chronic thickening and discoloration of both hallux toenails with persistent dystrophic changes. Differential includes onychomycosis versus post-traumatic nail dystrophy. - Obtained nail samples for fungal culture to differentiate onychomycosis from post-traumatic nail dystrophy. - Will contact her with culture results and discuss management based on findings. - If culture confirms onychomycosis, discussed treatment options: oral antifungals (e.g., terbinafine ), topical antifungals, and nail removal. - Reviewed need for liver function monitoring with oral antifungals and potential adverse effects including hepatotoxicity, rash, gastrointestinal upset, myalgias, and metallic taste. - Recommended biotin or hair/skin/nail vitamin supplementation to support nail health. - Will send results and follow-up instructions via patient portal message.   Return for nail culture result.   [name] DPM  "

## 2024-07-24 ENCOUNTER — Ambulatory Visit: Payer: Self-pay | Admitting: Podiatry
